# Patient Record
Sex: Female | Born: 1945 | Race: White | Hispanic: No | Marital: Married | Smoking: Never smoker
Health system: Southern US, Community
[De-identification: ages and names within clinical notes are randomized; demographics above are authoritative.]

## PROBLEM LIST (undated history)

## (undated) DIAGNOSIS — M1990 Unspecified osteoarthritis, unspecified site: Secondary | ICD-10-CM

## (undated) DIAGNOSIS — E785 Hyperlipidemia, unspecified: Secondary | ICD-10-CM

## (undated) DIAGNOSIS — G8929 Other chronic pain: Secondary | ICD-10-CM

## (undated) DIAGNOSIS — M810 Age-related osteoporosis without current pathological fracture: Secondary | ICD-10-CM

## (undated) DIAGNOSIS — Z8719 Personal history of other diseases of the digestive system: Secondary | ICD-10-CM

## (undated) DIAGNOSIS — M545 Low back pain, unspecified: Secondary | ICD-10-CM

## (undated) DIAGNOSIS — G40909 Epilepsy, unspecified, not intractable, without status epilepticus: Secondary | ICD-10-CM

## (undated) DIAGNOSIS — E039 Hypothyroidism, unspecified: Secondary | ICD-10-CM

## (undated) DIAGNOSIS — I480 Paroxysmal atrial fibrillation: Secondary | ICD-10-CM

## (undated) DIAGNOSIS — M419 Scoliosis, unspecified: Secondary | ICD-10-CM

## (undated) DIAGNOSIS — Z87442 Personal history of urinary calculi: Secondary | ICD-10-CM

## (undated) DIAGNOSIS — J45909 Unspecified asthma, uncomplicated: Secondary | ICD-10-CM

## (undated) DIAGNOSIS — K219 Gastro-esophageal reflux disease without esophagitis: Secondary | ICD-10-CM

## (undated) HISTORY — PX: VAGINAL HYSTERECTOMY: SUR661

## (undated) HISTORY — DX: Age-related osteoporosis without current pathological fracture: M81.0

## (undated) HISTORY — DX: Hyperlipidemia, unspecified: E78.5

## (undated) HISTORY — DX: Epilepsy, unspecified, not intractable, without status epilepticus: G40.909

## (undated) HISTORY — DX: Paroxysmal atrial fibrillation: I48.0

## (undated) HISTORY — DX: Gastro-esophageal reflux disease without esophagitis: K21.9

## (undated) HISTORY — DX: Hypothyroidism, unspecified: E03.9

## (undated) HISTORY — PX: TONSILLECTOMY: SUR1361

---

## 2004-06-01 DIAGNOSIS — J45909 Unspecified asthma, uncomplicated: Secondary | ICD-10-CM | POA: Insufficient documentation

## 2005-11-25 DIAGNOSIS — M818 Other osteoporosis without current pathological fracture: Secondary | ICD-10-CM | POA: Insufficient documentation

## 2006-02-26 DIAGNOSIS — D179 Benign lipomatous neoplasm, unspecified: Secondary | ICD-10-CM | POA: Insufficient documentation

## 2012-07-10 DIAGNOSIS — J309 Allergic rhinitis, unspecified: Secondary | ICD-10-CM | POA: Insufficient documentation

## 2017-01-30 HISTORY — PX: CARDIAC CATHETERIZATION: SHX172

## 2017-06-02 ENCOUNTER — Ambulatory Visit (INDEPENDENT_AMBULATORY_CARE_PROVIDER_SITE_OTHER): Payer: Medicare Other | Admitting: Internal Medicine

## 2017-06-02 ENCOUNTER — Other Ambulatory Visit: Payer: Self-pay | Admitting: Internal Medicine

## 2017-06-02 ENCOUNTER — Encounter: Payer: Self-pay | Admitting: Internal Medicine

## 2017-06-02 VITALS — BP 120/80 | HR 131 | Ht 63.0 in | Wt 139.0 lb

## 2017-06-02 DIAGNOSIS — I4891 Unspecified atrial fibrillation: Secondary | ICD-10-CM | POA: Diagnosis not present

## 2017-06-02 MED ORDER — FLECAINIDE ACETATE 50 MG PO TABS: 50.0000 mg | ORAL_TABLET | Freq: Two times a day (BID) | ORAL | 11 refills | Status: DC

## 2017-06-02 NOTE — Progress Notes (Signed)
Electrophysiology Office Note   Date:  06/02/2017   ID:  Desiree Everett, DOB 03-Nov-1945, MRN [id number]  PCP:  Romeo Rabonaplan, Michael, MD  Cardiologist:  Dr Mertha BaarsZachari Primary Electrophysiologist: Hillis RangeJames Carmin Alvidrez, MD    CC: afib   History of Present Illness: Desiree Everett is a 72 y.o. female who presents today for electrophysiology evaluation.   She presents as self referral for afib evaluation.  She reports being diagnosed with afib in September after presenting for colonoscopy.  In retrospect, she thinks she has had afib "for years".  She presented with AF with RVR and was admitted to [facility].  She was placed on eliquis and metoprolol.   Episodes have been increasing in frequency and duration since that time.  She has tachypalpitations and fatigue.  She feels "washed out" after her afib. She has been evaluated by Dr Mertha BaarsZachari.  She underwent cath which revealed no CAD (his note reviewed).  Due to ongoing symptoms, she now presents for EP consultation.  Today, she denies symptoms of chest pain, shortness of breath, orthopnea, PND, lower extremity edema, claudication, dizziness, presyncope, syncope, bleeding, or neurologic sequela. The patient is tolerating medications without difficulties and is otherwise without complaint today.    Past Medical History:  Diagnosis Date  . GERD (gastroesophageal reflux disease)   . Hyperlipidemia   . Hypothyroidism   . Osteoporosis   . Paroxysmal atrial fibrillation (HCC)   . Seizure disorder (HCC)    no seizures since age [age]s, on keppra chronically   FH- + CAD   Current Outpatient Medications  Medication Sig Dispense Refill  . atorvastatin (LIPITOR) 10 MG tablet Take 10 mg by mouth at bedtime.  11  . cholecalciferol (VITAMIN D) 1000 units tablet Take 1,000 Units by mouth daily.    Marland Kitchen. denosumab (PROLIA) 60 MG/ML SOLN injection Inject 60 mg into the skin every 6 (six) months. Administer in upper arm, thigh, or abdomen    . ELIQUIS 5  MG TABS tablet Take 5 mg by mouth daily.    Marland Kitchen. esomeprazole (NEXIUM) 40 MG capsule Take 40 mg by mouth daily.  5  . HYDROcodone-acetaminophen (NORCO/VICODIN) 5-325 MG tablet Take 1 tablet by mouth 3 (three) times daily as needed.  0  . levETIRAcetam (KEPPRA) 500 MG tablet Take 500 mg by mouth 2 (two) times daily.    Marland Kitchen. levothyroxine (SYNTHROID, LEVOTHROID) 50 MCG tablet Take 50 mcg by mouth daily.  11  . metoprolol succinate (TOPROL-XL) 25 MG 24 hr tablet Take 25 mg by mouth 2 (two) times daily.    . Multiple Vitamin (MULTIVITAMIN) capsule Take 1 capsule by mouth daily.    . flecainide (TAMBOCOR) 50 MG tablet Take 1 tablet (50 mg total) by mouth 2 (two) times daily. 60 tablet 11   No current facility-administered medications for this visit.     Allergies:   Patient has no known allergies.   Social History:  The patient  reports that  has never smoked. she has never used smokeless tobacco. She reports that she does not drink alcohol or use drugs.     ROS:  Please see the history of present illness.   All other systems are personally reviewed and negative.    PHYSICAL EXAM: VS:  BP 120/80   Pulse (!) 131   Ht 5\' 3"  (1.6 m)   Wt 139 lb (63 kg)   BMI 24.62 kg/m  , BMI Body mass index is 24.62 kg/m. GEN: Well nourished, well developed, in no  acute distress  HEENT: normal  Neck: no JVD, carotid bruits, or masses Cardiac: iRRR; no murmurs, rubs, or gallops,no edema  Respiratory:  clear to auscultation bilaterally, normal work of breathing GI: soft, nontender, nondistended, + BS MS: no deformity or atrophy  Skin: warm and dry  Neuro:  Strength and sensation are intact Psych: euthymic mood, full affect  EKG:  EKG is ordered today. The ekg ordered today is personally reviewed and shows afib with RVR (V rates 130s)    Wt Readings from Last 3 Encounters:  06/02/17 139 lb (63 kg)      Other studies personally reviewed: Additional studies/ records that were reviewed today include: Dr  Charlyne Petrin notes,   Review of the above records today demonstrates: EF normal by echo at [facility], no CAD by cath   ASSESSMENT AND PLAN:  1.  Paroxysmal atrial fibrillation The patient has symptomatic atrial fibrillation.  V rates are elevated.  She will take additional metoprolol with V rates > 100 bpm.  Given resting heart rate of 60s, I am reluctant to increase standing dose currently. Therapeutic strategies for afib including medicine and ablation were discussed in detail with the patient today. Risk, benefits, and alternatives to flecainide was discussed.  She wishes to try this medicine.  I will therefore start flecainide 50mg  BID Follow-up with me in [place] in 2-3 weeks. chads2vasc score is 2.  On eliquis    Follow-up:  Follow-up with me in [place] in 2-3 weeks.  Current medicines are reviewed at length with the patient today.   The patient does not have concerns regarding her medicines.  The following changes were made today:  none  Labs/ tests ordered today include:  Orders Placed This Encounter  Procedures  . EKG 12-Lead     Signed, Hillis Range, MD  06/02/2017 12:11 PM     [facility] [address] [place] [address] [phone] (office) [phone] (fax)

## 2017-06-02 NOTE — Patient Instructions (Addendum)
Medication Instructions:  Your physician has recommended you make the following change in your medication:  1.  Start taking flecainide 50 mg one tablet by mouth twice a day.  Labwork: None ordered.  Testing/Procedures: None ordered.  Follow-Up: Your physician wants you to follow-up in: [place] with Dr. Johney FrameAllred on June 20, 2017 @ 11:15 am.  Any Other Special Instructions Will Be Listed Below (If Applicable).  If you need a refill on your cardiac medications before your next appointment, please call your pharmacy.  Flecainide tablets What is this medicine? FLECAINIDE (FLEK a nide) is an antiarrhythmic drug. This medicine is used to prevent irregular heart rhythm. It can also slow down fast heartbeats called tachycardia. This medicine may be used for other purposes; ask your health care provider or pharmacist if you have questions. COMMON BRAND NAME(S): Tambocor What should I tell my health care provider before I take this medicine? They need to know if you have any of these conditions: -abnormal levels of potassium in the blood -heart disease including heart rhythm and heart rate problems -kidney or liver disease -recent heart attack -an unusual or allergic reaction to flecainide, local anesthetics, other medicines, foods, dyes, or preservatives -pregnant or trying to get pregnant -breast-feeding How should I use this medicine? Take this medicine by mouth with a glass of water. Follow the directions on the prescription label. You can take this medicine with or without food. Take your doses at regular intervals. Do not take your medicine more often than directed. Do not stop taking this medicine suddenly. This may cause serious, heart-related side effects. If your doctor wants you to stop the medicine, the dose may be slowly lowered over time to avoid any side effects. Talk to your pediatrician regarding the use of this medicine in children. While this drug may be prescribed for children  as young as 1 year of age for selected conditions, precautions do apply. Overdosage: If you think you have taken too much of this medicine contact a poison control center or emergency room at once. NOTE: This medicine is only for you. Do not share this medicine with others. What if I miss a dose? If you miss a dose, take it as soon as you can. If it is almost time for your next dose, take only that dose. Do not take double or extra doses. What may interact with this medicine? Do not take this medicine with any of the following medications: -amoxapine -arsenic trioxide -certain antibiotics like clarithromycin, erythromycin, gatifloxacin, gemifloxacin, levofloxacin, moxifloxacin, sparfloxacin, or troleandomycin -certain antidepressants called tricyclic antidepressants like amitriptyline, imipramine, or nortriptyline -certain medicines to control heart rhythm like disopyramide, dofetilide, encainide, moricizine, procainamide, propafenone, and quinidine -cisapride -cyclobenzaprine -delavirdine -droperidol -haloperidol -hawthorn -imatinib -levomethadyl -maprotiline -medicines for malaria like chloroquine and halofantrine -pentamidine -phenothiazines like chlorpromazine, mesoridazine, prochlorperazine, thioridazine -pimozide -quinine -ranolazine -ritonavir -sertindole -ziprasidone This medicine may also interact with the following medications: -cimetidine -medicines for angina or high blood pressure -medicines to control heart rhythm like amiodarone and digoxin This list may not describe all possible interactions. Give your health care provider a list of all the medicines, herbs, non-prescription drugs, or dietary supplements you use. Also tell them if you smoke, drink alcohol, or use illegal drugs. Some items may interact with your medicine. What should I watch for while using this medicine? Visit your doctor or health care professional for regular checks on your progress. Because your  condition and the use of this medicine carries some risk, it is a good  idea to carry an identification card, necklace or bracelet with details of your condition, medications and doctor or health care professional. Check your blood pressure and pulse rate regularly. Ask your health care professional what your blood pressure and pulse rate should be, and when you should contact him or her. Your doctor or health care professional also may schedule regular blood tests and electrocardiograms to check your progress. You may get drowsy or dizzy. Do not drive, use machinery, or do anything that needs mental alertness until you know how this medicine affects you. Do not stand or sit up quickly, especially if you are an older patient. This reduces the risk of dizzy or fainting spells. Alcohol can make you more dizzy, increase flushing and rapid heartbeats. Avoid alcoholic drinks. What side effects may I notice from receiving this medicine? Side effects that you should report to your doctor or health care professional as soon as possible: -chest pain, continued irregular heartbeats -difficulty breathing -swelling of the legs or feet -trembling, shaking -unusually weak or tired Side effects that usually do not require medical attention (report to your doctor or health care professional if they continue or are bothersome): -blurred vision -constipation -headache -nausea, vomiting -stomach pain This list may not describe all possible side effects. Call your doctor for medical advice about side effects. You may report side effects to FDA at 1-800-FDA-1088. Where should I keep my medicine? Keep out of the reach of children. Store at room temperature between 15 and 30 degrees C (59 and 86 degrees F). Protect from light. Keep container tightly closed. Throw away any unused medicine after the expiration date. NOTE: This sheet is a summary. It may not cover all possible information. If you have questions about this  medicine, talk to your doctor, pharmacist, or health care provider.  2018 Elsevier/Gold Standard (2007-07-22 16:46:09)

## 2017-06-20 ENCOUNTER — Encounter: Payer: Self-pay | Admitting: Internal Medicine

## 2017-06-20 ENCOUNTER — Other Ambulatory Visit: Payer: Self-pay

## 2017-06-20 ENCOUNTER — Ambulatory Visit (INDEPENDENT_AMBULATORY_CARE_PROVIDER_SITE_OTHER): Payer: Medicare Other | Admitting: Internal Medicine

## 2017-06-20 ENCOUNTER — Other Ambulatory Visit: Payer: Self-pay | Admitting: Internal Medicine

## 2017-06-20 VITALS — BP 110/73 | HR 56 | Ht 63.0 in | Wt 148.0 lb

## 2017-06-20 DIAGNOSIS — I481 Persistent atrial fibrillation: Secondary | ICD-10-CM | POA: Diagnosis not present

## 2017-06-20 DIAGNOSIS — I4819 Other persistent atrial fibrillation: Secondary | ICD-10-CM

## 2017-06-20 MED ORDER — FLECAINIDE ACETATE 100 MG PO TABS: 100.0000 mg | ORAL_TABLET | Freq: Two times a day (BID) | ORAL | 6 refills | Status: DC

## 2017-06-20 NOTE — Progress Notes (Signed)
PCP: Romeo Rabon, MD Primary Cardiologist: Dr Rockne Menghini Primary EP: Dr Neale Burly Desiree Everett is a 72 y.o. female who presents today for routine electrophysiology followup.  Since last being seen in our clinic, the patient reports doing reasonably well. She continues to have afib.  + palpitations and fatigue.  Today, she denies symptoms of chest pain, shortness of breath,  lower extremity edema, dizziness, presyncope, or syncope.  The patient is otherwise without complaint today.   Past Medical History:  Diagnosis Date  . GERD (gastroesophageal reflux disease)   . Hyperlipidemia   . Hypothyroidism   . Osteoporosis   . Paroxysmal atrial fibrillation (HCC)   . Seizure disorder (HCC)    no seizures since age 42s, on keppra chronically   ROS- all systems are reviewed and negatives except as per HPI above  Current Outpatient Medications  Medication Sig Dispense Refill  . atorvastatin (LIPITOR) 10 MG tablet Take 10 mg by mouth at bedtime.  11  . cholecalciferol (VITAMIN D) 1000 units tablet Take 1,000 Units by mouth daily.    Marland Kitchen denosumab (PROLIA) 60 MG/ML SOLN injection Inject 60 mg into the skin every 6 (six) months. Administer in upper arm, thigh, or abdomen    . ELIQUIS 5 MG TABS tablet Take 5 mg by mouth daily.    Marland Kitchen esomeprazole (NEXIUM) 40 MG capsule Take 40 mg by mouth daily.  5  . flecainide (TAMBOCOR) 50 MG tablet TAKE 1 TABLET(50 MG) BY MOUTH TWICE DAILY 180 tablet 3  . HYDROcodone-acetaminophen (NORCO/VICODIN) 5-325 MG tablet Take 1 tablet by mouth 3 (three) times daily as needed.  0  . levETIRAcetam (KEPPRA) 500 MG tablet Take 500 mg by mouth 2 (two) times daily.    Marland Kitchen levothyroxine (SYNTHROID, LEVOTHROID) 50 MCG tablet Take 50 mcg by mouth daily.  11  . metoprolol succinate (TOPROL-XL) 25 MG 24 hr tablet Take 25 mg by mouth 2 (two) times daily.    . Multiple Vitamin (MULTIVITAMIN) capsule Take 1 capsule by mouth daily.     No current facility-administered medications  for this visit.     Physical Exam: Vitals:   06/20/17 1100  BP: 110/73  Pulse: (!) 56  SpO2: 98%  Weight: 148 lb (67.1 kg)  Height: 5\' 3"  (1.6 m)    GEN- The patient is well appearing, alert and oriented x 3 today.   Head- normocephalic, atraumatic Eyes-  Sclera clear, conjunctiva pink Ears- hearing intact Oropharynx- clear Lungs- Clear to ausculation bilaterally, normal work of breathing Heart- irregular rate and rhythm, no murmurs, rubs or gallops, PMI not laterally displaced GI- soft, NT, ND, + BS Extremities- no clubbing, cyanosis, or edema  EKG tracing ordered today is personally reviewed and shows sinus rhythm 62 bpm with PACs, PR 162 msec, Qtc 450 msec  Assessment and Plan:  1. Persistent atrial fibrillation Continues to have afib, which appears to now be more persistent.  She is actually in sinus rhythm today. She has just worn a holter monitor from Dr Oneita Jolly office which shows afib 100% of the time (I do not have actual monitor tracings to review) chads2vasc score is 2.  Continue eliquis Therapeutic strategies for afib including medicine (increased flecainide, multaq, tikosyn) and ablation were discussed in detail with the patient today. Risk, benefits, and alternatives to EP study and radiofrequency ablation for afib were also discussed in detail today. These risks include but are not limited to stroke, bleeding, vascular damage, tamponade, perforation, damage to the esophagus, lungs, and  other structures, pulmonary vein stenosis, worsening renal function, and death. The patient understands these risk and wishes to continue medical therapy.  She would prefer to try increasing flecainide. I will therefore increase flecainide to 100mg  BID Return on Monday for ekg I will see again in 2-3 weeks   Hillis RangeJames Desiree Hatcher MD, [facility] 06/20/2017 11:30 AM

## 2017-06-20 NOTE — Patient Instructions (Addendum)
Medication Instructions:   Increase Flecainide to 100mg  twice a day.  Continue all other medications.    Labwork: none  Testing/Procedures: none  Follow-Up: 2 weeks - Dr. Johney FrameAllred   Any Other Special Instructions Will Be Listed Below (If Applicable). EKG - nurse visit Monday.   If you need a refill on your cardiac medications before your next appointment, please call your pharmacy.

## 2017-06-23 ENCOUNTER — Ambulatory Visit (INDEPENDENT_AMBULATORY_CARE_PROVIDER_SITE_OTHER): Payer: Medicare Other | Admitting: *Deleted

## 2017-06-23 DIAGNOSIS — I481 Persistent atrial fibrillation: Secondary | ICD-10-CM | POA: Diagnosis not present

## 2017-06-23 DIAGNOSIS — I4819 Other persistent atrial fibrillation: Secondary | ICD-10-CM

## 2017-06-23 NOTE — Progress Notes (Signed)
Patient in office this morning for EKG.  Recent increase on her Flecainide to 100mg  twice a day.  Did notice slight dizziness on the first day of the increase, but tolerating well since without difficulty.  Follow up already scheduled with Dr. Johney FrameAllred for 07/04/2017 [place] in [place].

## 2017-06-23 NOTE — Addendum Note (Signed)
Addended by: Lesle ChrisHILL, Nida Manfredi G on: 06/23/2017 12:32 PM   Modules accepted: Orders

## 2017-06-27 NOTE — Progress Notes (Signed)
Patient notified and verbalized understanding. 

## 2017-06-27 NOTE — Progress Notes (Signed)
Hillis RangeAllred, James, MD  Lesle ChrisHill, Alvia Tory G, LPN        Thanks!  Please let her know that I think the ekg looks ok  Continue current dose unless problems arise.

## 2017-07-04 ENCOUNTER — Encounter: Payer: Self-pay | Admitting: *Deleted

## 2017-07-04 ENCOUNTER — Ambulatory Visit (INDEPENDENT_AMBULATORY_CARE_PROVIDER_SITE_OTHER): Payer: Medicare Other | Admitting: Internal Medicine

## 2017-07-04 VITALS — BP 102/74 | HR 49 | Ht 63.0 in | Wt 142.0 lb

## 2017-07-04 DIAGNOSIS — I4819 Other persistent atrial fibrillation: Secondary | ICD-10-CM

## 2017-07-04 DIAGNOSIS — I481 Persistent atrial fibrillation: Secondary | ICD-10-CM | POA: Diagnosis not present

## 2017-07-04 NOTE — Patient Instructions (Signed)
Medication Instructions:   Your physician has recommended you make the following change in your medication:   Decrease toprol xl to 25 mg by mouth daily.  Continue all other medications the same.  Labwork:  NONE  Testing/Procedures: Your physician has recommended that you have an ablation. Catheter ablation is a medical procedure used to treat some cardiac arrhythmias (irregular heartbeats). During catheter ablation, a long, thin, flexible tube is put into a blood vessel in your groin (upper thigh), or neck. This tube is called an ablation catheter. It is then guided to your heart through the blood vessel. Radio frequency waves destroy small areas of heart tissue where abnormal heartbeats may cause an arrhythmia to start. Please see the instruction sheet given to you today. Ancil BoozerJenny Smith RN will contact you about this procedure.  Follow-Up:  Your physician recommends that you schedule a follow-up appointment in: pending.  Any Other Special Instructions Will Be Listed Below (If Applicable).  If you need a refill on your cardiac medications before your next appointment, please call your pharmacy.

## 2017-07-04 NOTE — Progress Notes (Signed)
PCP: Romeo Rabonaplan, Michael, MD Primary Cardiologist: Dr Rockne MenghiniZakhary Primary EP: Dr Neale BurlyAllred  Angella Ewing Smitty CordsBruce is a 72 y.o. female who presents today for routine electrophysiology followup.  Since last being seen in our clinic, the patient reports doing very well. + dizziness with flecainide.  + fatigue.  Still having afib, though less often.  Today, she denies symptoms of  chest pain, shortness of breath,  lower extremity edema, presyncope, or syncope.  The patient is otherwise without complaint today.   Past Medical History:  Diagnosis Date  . GERD (gastroesophageal reflux disease)   . Hyperlipidemia   . Hypothyroidism   . Osteoporosis   . Paroxysmal atrial fibrillation (HCC)   . Seizure disorder (HCC)    no seizures since age [age]s, on keppra chronically   Past Surgical History:  Procedure Laterality Date  . ABDOMINAL HYSTERECTOMY      ROS- all systems are reviewed and negatives except as per HPI above  Current Outpatient Medications  Medication Sig Dispense Refill  . atorvastatin (LIPITOR) 10 MG tablet Take 10 mg by mouth at bedtime.  11  . cholecalciferol (VITAMIN D) 1000 units tablet Take 1,000 Units by mouth daily.    Marland Kitchen. denosumab (PROLIA) 60 MG/ML SOLN injection Inject 60 mg into the skin every 6 (six) months. Administer in upper arm, thigh, or abdomen    . ELIQUIS 5 MG TABS tablet Take 5 mg by mouth daily.    Marland Kitchen. esomeprazole (NEXIUM) 40 MG capsule Take 40 mg by mouth daily.  5  . flecainide (TAMBOCOR) 100 MG tablet TAKE 1 TABLET BY MOUTH TWICE DAILY 180 tablet 6  . HYDROcodone-acetaminophen (NORCO/VICODIN) 5-325 MG tablet Take 1 tablet by mouth 3 (three) times daily as needed.  0  . levETIRAcetam (KEPPRA) 500 MG tablet Take 500 mg by mouth 2 (two) times daily.    Marland Kitchen. levothyroxine (SYNTHROID, LEVOTHROID) 50 MCG tablet Take 50 mcg by mouth daily.  11  . metoprolol succinate (TOPROL-XL) 25 MG 24 hr tablet Take 25 mg by mouth 2 (two) times daily.    . Multiple Vitamin (MULTIVITAMIN)  capsule Take 1 capsule by mouth daily.     No current facility-administered medications for this visit.     Physical Exam: Vitals:   07/04/17 1101  BP: 102/74  Pulse: (!) 49  SpO2: 98%  Weight: 142 lb (64.4 kg)  Height: 5\' 3"  (1.6 m)    GEN- The patient is well appearing, alert and oriented x 3 today.   Head- normocephalic, atraumatic Eyes-  Sclera clear, conjunctiva pink Ears- hearing intact Oropharynx- clear Lungs- Clear to ausculation bilaterally, normal work of breathing Heart- Regular rate and rhythm, no murmurs, rubs or gallops, PMI not laterally displaced GI- soft, NT, ND, + BS Extremities- no clubbing, cyanosis, or edema  EKG tracing ordered today is personally reviewed and shows sinus bradycardia 44 bpm, PR 176 msec, Qtc 427 msec  Assessment and Plan:  1. Persistent afib She returns today for follow-up on flecainide 100mg  BID She continues to have afib and is not tolerating her medicines well.  Further changes are limited by bradycardia On eliquis (chads2vasc score is 2) Therapeutic strategies for afib including medicine and ablation were discussed in detail with the patient today. Risk, benefits, and alternatives to EP study and radiofrequency ablation for afib were also discussed in detail today. These risks include but are not limited to stroke, bleeding, vascular damage, tamponade, perforation, damage to the esophagus, lungs, and other structures, pulmonary vein stenosis, worsening renal  function, and death. The patient understands these risk and wishes to proceed.  We will therefore proceed with catheter ablation at the next available time.  Will require cardiac CT several days prior to exclude LAA thrombus.  Carto/ICE/ anesthesia will be required  Reduce toprol to 25mg  daily today.  If still symptomatic with bradycardia, reduce toprol to 12.5 mg BID Could also reduce flecainide if needed   Hillis Range MD, [facility] 07/04/2017 11:30 AM

## 2017-07-09 ENCOUNTER — Telehealth: Payer: Self-pay

## 2017-07-09 DIAGNOSIS — I4819 Other persistent atrial fibrillation: Secondary | ICD-10-CM

## 2017-07-09 NOTE — Telephone Encounter (Signed)
Call placed to Pt.  Pt would like to schedule afib ablation for Aug 12, 2017 first case.  Will call Pt tomorrow to discuss details.  Pt indicates understanding.

## 2017-07-10 NOTE — Telephone Encounter (Signed)
Pt scheduled for afib ablation Aug 12, 2017 Pt will come in for pre procedure labs Aug 05, 2017 and pick up her instructions for cardiac CT and ablation at that time. Will need cardiac CT scheduled. No further action at this time

## 2017-08-05 ENCOUNTER — Other Ambulatory Visit: Payer: Medicare Other

## 2017-08-06 ENCOUNTER — Other Ambulatory Visit: Payer: Medicare Other | Admitting: *Deleted

## 2017-08-06 DIAGNOSIS — I4819 Other persistent atrial fibrillation: Secondary | ICD-10-CM

## 2017-08-06 LAB — BASIC METABOLIC PANEL
BUN/Creatinine Ratio: 18 (ref 12–28)
BUN: 15 mg/dL (ref 8–27)
CALCIUM: 9.5 mg/dL (ref 8.7–10.3)
CO2: 26 mmol/L (ref 20–29)
CREATININE: 0.84 mg/dL (ref 0.57–1.00)
Chloride: 103 mmol/L (ref 96–106)
GFR calc Af Amer: 80 mL/min/{1.73_m2} (ref >59)
GFR, EST NON AFRICAN AMERICAN: 70 mL/min/{1.73_m2} (ref >59)
Glucose: 84 mg/dL (ref 65–99)
Potassium: 4.6 mmol/L (ref 3.5–5.2)
Sodium: 143 mmol/L (ref 134–144)

## 2017-08-06 LAB — CBC WITH DIFFERENTIAL/PLATELET
Basophils Absolute: 0 10*3/uL (ref 0.0–0.2)
Basos: 0 %
EOS (ABSOLUTE): 0 10*3/uL (ref 0.0–0.4)
EOS: 1 %
HEMATOCRIT: 37.5 % (ref 34.0–46.6)
Hemoglobin: 12.4 g/dL (ref 11.1–15.9)
IMMATURE GRANULOCYTES: 0 %
Immature Grans (Abs): 0 10*3/uL (ref 0.0–0.1)
LYMPHS ABS: 1.1 10*3/uL (ref 0.7–3.1)
Lymphs: 31 %
MCH: 31.3 pg (ref 26.6–33.0)
MCHC: 33.1 g/dL (ref 31.5–35.7)
MCV: 95 fL (ref 79–97)
Monocytes Absolute: 0.5 10*3/uL (ref 0.1–0.9)
Monocytes: 13 %
NEUTROS PCT: 55 %
Neutrophils Absolute: 2 10*3/uL (ref 1.4–7.0)
Platelets: 149 10*3/uL — ABNORMAL LOW (ref 150–379)
RBC: 3.96 x10E6/uL (ref 3.77–5.28)
RDW: 13.5 % (ref 12.3–15.4)
WBC: 3.6 10*3/uL (ref 3.4–10.8)

## 2017-08-07 ENCOUNTER — Ambulatory Visit (HOSPITAL_COMMUNITY)
Admission: RE | Admit: 2017-08-07 | Discharge: 2017-08-07 | Disposition: A | Discharge status: Home / Self Care | Payer: Medicare Other | Source: Ambulatory Visit | Attending: Internal Medicine | Admitting: Internal Medicine

## 2017-08-07 ENCOUNTER — Encounter (HOSPITAL_COMMUNITY): Payer: Self-pay

## 2017-08-07 ENCOUNTER — Ambulatory Visit (HOSPITAL_COMMUNITY): Payer: Medicare Other

## 2017-08-07 DIAGNOSIS — K449 Diaphragmatic hernia without obstruction or gangrene: Secondary | ICD-10-CM | POA: Diagnosis not present

## 2017-08-07 DIAGNOSIS — I4819 Other persistent atrial fibrillation: Secondary | ICD-10-CM

## 2017-08-07 DIAGNOSIS — I481 Persistent atrial fibrillation: Secondary | ICD-10-CM | POA: Insufficient documentation

## 2017-08-07 DIAGNOSIS — I4891 Unspecified atrial fibrillation: Secondary | ICD-10-CM | POA: Diagnosis not present

## 2017-08-07 MED ORDER — IOPAMIDOL (ISOVUE-370) INJECTION 76%
100.0000 mL | Freq: Once | INTRAVENOUS | Status: AC | PRN
  Administered 2017-08-07: 80 mL via INTRAVENOUS

## 2017-08-07 MED ORDER — IOPAMIDOL (ISOVUE-370) INJECTION 76%
INTRAVENOUS | Status: AC
  Filled 2017-08-07: qty 100

## 2017-08-11 ENCOUNTER — Encounter (HOSPITAL_COMMUNITY): Payer: Self-pay | Admitting: Anesthesiology

## 2017-08-11 NOTE — Anesthesia Preprocedure Evaluation (Addendum)
Anesthesia Evaluation  Patient identified by MRN, date of birth, ID band Patient awake    Reviewed: Allergy & Precautions, NPO status , Patient's Chart, lab work & pertinent test results  Airway Mallampati: I       Dental no notable dental hx. (+) Teeth Intact   Pulmonary neg pulmonary ROS,    Pulmonary exam normal breath sounds clear to auscultation       Cardiovascular negative cardio ROS Normal cardiovascular exam Rhythm:Regular Rate:Normal     Neuro/Psych Seizures -, Well Controlled,  negative psych ROS   GI/Hepatic Neg liver ROS, GERD  Medicated,  Endo/Other  Hypothyroidism   Renal/GU negative Renal ROS  negative genitourinary   Musculoskeletal negative musculoskeletal ROS (+)   Abdominal Normal abdominal exam  (+)   Peds  Hematology   Anesthesia Other Findings   Reproductive/Obstetrics                            Anesthesia Physical Anesthesia Plan  ASA: II  Anesthesia Plan: General   Post-op Pain Management:    Induction:   PONV Risk Score and Plan: 3 and Ondansetron and Dexamethasone  Airway Management Planned: LMA  Additional Equipment:   Intra-op Plan:   Post-operative Plan:   Informed Consent: I have reviewed the patients History and Physical, chart, labs and discussed the procedure including the risks, benefits and alternatives for the proposed anesthesia with the patient or authorized representative who has indicated his/her understanding and acceptance.     Plan Discussed with: CRNA  Anesthesia Plan Comments:        Anesthesia Quick Evaluation

## 2017-08-12 ENCOUNTER — Ambulatory Visit (HOSPITAL_COMMUNITY): Payer: Medicare Other | Admitting: Anesthesiology

## 2017-08-12 ENCOUNTER — Ambulatory Visit (HOSPITAL_COMMUNITY)
Admission: RE | Admit: 2017-08-12 | Discharge: 2017-08-13 | Disposition: A | Discharge status: Home / Self Care | Payer: Medicare Other | Source: Ambulatory Visit | Attending: Internal Medicine | Admitting: Internal Medicine

## 2017-08-12 ENCOUNTER — Other Ambulatory Visit: Payer: Self-pay

## 2017-08-12 ENCOUNTER — Encounter (HOSPITAL_COMMUNITY)
Admission: RE | Disposition: A | Discharge status: Home / Self Care | Payer: Self-pay | Source: Ambulatory Visit | Attending: Internal Medicine

## 2017-08-12 ENCOUNTER — Encounter (HOSPITAL_COMMUNITY): Payer: Self-pay | Admitting: Certified Registered Nurse Anesthetist

## 2017-08-12 DIAGNOSIS — Z79899 Other long term (current) drug therapy: Secondary | ICD-10-CM | POA: Diagnosis not present

## 2017-08-12 DIAGNOSIS — I480 Paroxysmal atrial fibrillation: Secondary | ICD-10-CM | POA: Diagnosis present

## 2017-08-12 DIAGNOSIS — Z7983 Long term (current) use of bisphosphonates: Secondary | ICD-10-CM | POA: Diagnosis not present

## 2017-08-12 DIAGNOSIS — Z7901 Long term (current) use of anticoagulants: Secondary | ICD-10-CM | POA: Insufficient documentation

## 2017-08-12 DIAGNOSIS — E039 Hypothyroidism, unspecified: Secondary | ICD-10-CM | POA: Diagnosis not present

## 2017-08-12 DIAGNOSIS — Z79890 Hormone replacement therapy (postmenopausal): Secondary | ICD-10-CM | POA: Diagnosis not present

## 2017-08-12 DIAGNOSIS — K219 Gastro-esophageal reflux disease without esophagitis: Secondary | ICD-10-CM | POA: Insufficient documentation

## 2017-08-12 DIAGNOSIS — E785 Hyperlipidemia, unspecified: Secondary | ICD-10-CM | POA: Diagnosis not present

## 2017-08-12 DIAGNOSIS — M810 Age-related osteoporosis without current pathological fracture: Secondary | ICD-10-CM | POA: Diagnosis not present

## 2017-08-12 DIAGNOSIS — G40909 Epilepsy, unspecified, not intractable, without status epilepticus: Secondary | ICD-10-CM | POA: Insufficient documentation

## 2017-08-12 DIAGNOSIS — I481 Persistent atrial fibrillation: Secondary | ICD-10-CM | POA: Diagnosis not present

## 2017-08-12 HISTORY — DX: Low back pain, unspecified: M54.50

## 2017-08-12 HISTORY — DX: Unspecified asthma, uncomplicated: J45.909

## 2017-08-12 HISTORY — DX: Personal history of urinary calculi: Z87.442

## 2017-08-12 HISTORY — PX: ATRIAL FIBRILLATION ABLATION: EP1191

## 2017-08-12 HISTORY — DX: Other chronic pain: G89.29

## 2017-08-12 HISTORY — DX: Personal history of other diseases of the digestive system: Z87.19

## 2017-08-12 HISTORY — DX: Low back pain: M54.5

## 2017-08-12 HISTORY — DX: Scoliosis, unspecified: M41.9

## 2017-08-12 HISTORY — DX: Unspecified osteoarthritis, unspecified site: M19.90

## 2017-08-12 LAB — POCT ACTIVATED CLOTTING TIME
ACTIVATED CLOTTING TIME: 395 s
ACTIVATED CLOTTING TIME: 136 s

## 2017-08-12 SURGERY — ATRIAL FIBRILLATION ABLATION
Anesthesia: General

## 2017-08-12 MED ORDER — LEVETIRACETAM 500 MG PO TABS
500.0000 mg | ORAL_TABLET | Freq: Two times a day (BID) | ORAL | Status: DC
  Administered 2017-08-12: 22:00:00 500 mg via ORAL
  Filled 2017-08-12 (×2): qty 1

## 2017-08-12 MED ORDER — DEXTROSE 5 % IV SOLN
INTRAVENOUS | Status: DC | PRN
  Administered 2017-08-12: 15 ug/min via INTRAVENOUS

## 2017-08-12 MED ORDER — HEPARIN SODIUM (PORCINE) 1000 UNIT/ML IJ SOLN
INTRAMUSCULAR | Status: AC
  Filled 2017-08-12: qty 1

## 2017-08-12 MED ORDER — ISOPROTERENOL HCL 0.2 MG/ML IJ SOLN
INTRAVENOUS | Status: DC | PRN
  Administered 2017-08-12: 20 ug/min via INTRAVENOUS

## 2017-08-12 MED ORDER — METOPROLOL SUCCINATE ER 25 MG PO TB24: 6.2500 mg | ORAL_TABLET | Freq: Every day | ORAL | Status: DC

## 2017-08-12 MED ORDER — HEPARIN SODIUM (PORCINE) 1000 UNIT/ML IJ SOLN
INTRAMUSCULAR | Status: DC | PRN
  Administered 2017-08-12: 12000 [IU] via INTRAVENOUS
  Administered 2017-08-12: 1000 [IU] via INTRAVENOUS

## 2017-08-12 MED ORDER — HYDROCODONE-ACETAMINOPHEN 5-325 MG PO TABS: 1.0000 | ORAL_TABLET | ORAL | Status: DC | PRN

## 2017-08-12 MED ORDER — BUPIVACAINE HCL (PF) 0.25 % IJ SOLN
INTRAMUSCULAR | Status: DC | PRN
  Administered 2017-08-12: 45 mL

## 2017-08-12 MED ORDER — LEVOTHYROXINE SODIUM 50 MCG PO TABS: 50.0000 ug | ORAL_TABLET | Freq: Every day | ORAL | Status: DC

## 2017-08-12 MED ORDER — APIXABAN 5 MG PO TABS
5.0000 mg | ORAL_TABLET | Freq: Two times a day (BID) | ORAL | Status: DC
  Administered 2017-08-12 – 2017-08-13 (×2): 5 mg via ORAL
  Filled 2017-08-12 (×2): qty 1

## 2017-08-12 MED ORDER — IOPAMIDOL (ISOVUE-370) INJECTION 76%
INTRAVENOUS | Status: AC
  Filled 2017-08-12: qty 50

## 2017-08-12 MED ORDER — SODIUM CHLORIDE 0.9% FLUSH
3.0000 mL | Freq: Two times a day (BID) | INTRAVENOUS | Status: DC
  Administered 2017-08-12 (×2): 3 mL via INTRAVENOUS

## 2017-08-12 MED ORDER — BUPIVACAINE HCL (PF) 0.25 % IJ SOLN
INTRAMUSCULAR | Status: AC
  Filled 2017-08-12: qty 30

## 2017-08-12 MED ORDER — OFF THE BEAT BOOK
Freq: Once | Status: AC
  Administered 2017-08-12: 1
  Filled 2017-08-12: qty 1

## 2017-08-12 MED ORDER — SODIUM CHLORIDE 0.9 % IV SOLN
INTRAVENOUS | Status: DC
  Administered 2017-08-12 (×2): via INTRAVENOUS

## 2017-08-12 MED ORDER — IOPAMIDOL (ISOVUE-370) INJECTION 76%
INTRAVENOUS | Status: DC | PRN
  Administered 2017-08-12: 10 mL via INTRAVENOUS

## 2017-08-12 MED ORDER — HEPARIN (PORCINE) IN NACL 1000-0.9 UT/500ML-% IV SOLN
INTRAVENOUS | Status: AC
  Filled 2017-08-12: qty 500

## 2017-08-12 MED ORDER — FENTANYL CITRATE (PF) 250 MCG/5ML IJ SOLN
INTRAMUSCULAR | Status: DC | PRN
  Administered 2017-08-12 (×2): 50 ug via INTRAVENOUS

## 2017-08-12 MED ORDER — ONDANSETRON HCL 4 MG/2ML IJ SOLN
INTRAMUSCULAR | Status: DC | PRN
  Administered 2017-08-12: 4 mg via INTRAVENOUS

## 2017-08-12 MED ORDER — MIDAZOLAM HCL 2 MG/2ML IJ SOLN
INTRAMUSCULAR | Status: DC | PRN
  Administered 2017-08-12: 1 mg via INTRAVENOUS

## 2017-08-12 MED ORDER — PROPOFOL 10 MG/ML IV BOLUS
INTRAVENOUS | Status: DC | PRN
  Administered 2017-08-12: 100 mg via INTRAVENOUS

## 2017-08-12 MED ORDER — ACETAMINOPHEN 325 MG PO TABS: 650.0000 mg | ORAL_TABLET | ORAL | Status: DC | PRN

## 2017-08-12 MED ORDER — ONDANSETRON HCL 4 MG/2ML IJ SOLN: 4.0000 mg | Freq: Four times a day (QID) | INTRAMUSCULAR | Status: DC | PRN

## 2017-08-12 MED ORDER — SUGAMMADEX SODIUM 200 MG/2ML IV SOLN
INTRAVENOUS | Status: DC | PRN
  Administered 2017-08-12: 150 mg via INTRAVENOUS

## 2017-08-12 MED ORDER — SODIUM CHLORIDE 0.9% FLUSH: 3.0000 mL | INTRAVENOUS | Status: DC | PRN

## 2017-08-12 MED ORDER — ROCURONIUM BROMIDE 10 MG/ML (PF) SYRINGE
PREFILLED_SYRINGE | INTRAVENOUS | Status: DC | PRN
  Administered 2017-08-12: 40 mg via INTRAVENOUS

## 2017-08-12 MED ORDER — SODIUM CHLORIDE 0.9 % IV SOLN: 250.0000 mL | INTRAVENOUS | Status: DC | PRN

## 2017-08-12 MED ORDER — DEXAMETHASONE SODIUM PHOSPHATE 10 MG/ML IJ SOLN
INTRAMUSCULAR | Status: DC | PRN
  Administered 2017-08-12: 10 mg via INTRAVENOUS

## 2017-08-12 MED ORDER — HEPARIN (PORCINE) IN NACL 2-0.9 UNITS/ML
INTRAMUSCULAR | Status: AC | PRN
  Administered 2017-08-12: 500 mL

## 2017-08-12 MED ORDER — PHENYLEPHRINE HCL 10 MG/ML IJ SOLN
INTRAMUSCULAR | Status: DC | PRN
  Administered 2017-08-12: 80 ug via INTRAVENOUS

## 2017-08-12 MED ORDER — LIDOCAINE 2% (20 MG/ML) 5 ML SYRINGE
INTRAMUSCULAR | Status: DC | PRN
  Administered 2017-08-12: 60 mg via INTRAVENOUS

## 2017-08-12 MED ORDER — ISOPROTERENOL HCL 0.2 MG/ML IJ SOLN
INTRAMUSCULAR | Status: AC
  Filled 2017-08-12: qty 5

## 2017-08-12 MED ORDER — PROTAMINE SULFATE 10 MG/ML IV SOLN
INTRAVENOUS | Status: DC | PRN
  Administered 2017-08-12: 40 mg via INTRAVENOUS

## 2017-08-12 SURGICAL SUPPLY — 18 items
BLANKET WARM UNDERBOD FULL ACC (MISCELLANEOUS) ×3 IMPLANT
CATH MAPPNG PENTARAY F 2-6-2MM (CATHETERS) ×1 IMPLANT
CATH NAVISTAR SMARTTOUCH DF (ABLATOR) ×3 IMPLANT
CATH SOUNDSTAR 3D IMAGING (CATHETERS) ×3 IMPLANT
CATH WEBSTER BI DIR CS D-F CRV (CATHETERS) ×3 IMPLANT
COVER SWIFTLINK CONNECTOR (BAG) ×3 IMPLANT
NEEDLE TRANSEP BRK 71CM 407200 (NEEDLE) ×3 IMPLANT
PACK EP LATEX FREE (CUSTOM PROCEDURE TRAY) ×2
PACK EP LF (CUSTOM PROCEDURE TRAY) ×1 IMPLANT
PAD DEFIB LIFELINK (PAD) ×3 IMPLANT
PATCH CARTO3 (PAD) ×3 IMPLANT
PENTARAY F 2-6-2MM (CATHETERS) ×3
SHEATH AVANTI 11F 11CM (SHEATH) ×3 IMPLANT
SHEATH PINNACLE 7F 10CM (SHEATH) ×6 IMPLANT
SHEATH PINNACLE 9F 10CM (SHEATH) ×3 IMPLANT
SHEATH SWARTZ TS SL2 63CM 8.5F (SHEATH) ×3 IMPLANT
SHIELD RADPAD SCOOP 12X17 (MISCELLANEOUS) ×3 IMPLANT
TUBING SMART ABLATE COOLFLOW (TUBING) ×3 IMPLANT

## 2017-08-12 NOTE — H&P (Signed)
Desiree Everett is a 72 y.o. female who presents today for routine electrophysiology study and ablation for afib.   Today, she denies symptoms of  chest pain, shortness of breath,  lower extremity edema, presyncope, or syncope.  The patient is otherwise without complaint today.       Past Medical History:  Diagnosis Date  . GERD (gastroesophageal reflux disease)   . Hyperlipidemia   . Hypothyroidism   . Osteoporosis   . Paroxysmal atrial fibrillation (HCC)   . Seizure disorder (HCC)    no seizures since age 64s, on keppra chronically        Past Surgical History:  Procedure Laterality Date  . ABDOMINAL HYSTERECTOMY      ROS- all systems are reviewed and negatives except as per HPI above        Current Outpatient Medications  Medication Sig Dispense Refill  . atorvastatin (LIPITOR) 10 MG tablet Take 10 mg by mouth at bedtime.  11  . cholecalciferol (VITAMIN D) 1000 units tablet Take 1,000 Units by mouth daily.    Marland Kitchen denosumab (PROLIA) 60 MG/ML SOLN injection Inject 60 mg into the skin every 6 (six) months. Administer in upper arm, thigh, or abdomen    . ELIQUIS 5 MG TABS tablet Take 5 mg by mouth daily.    Marland Kitchen esomeprazole (NEXIUM) 40 MG capsule Take 40 mg by mouth daily.  5  . flecainide (TAMBOCOR) 100 MG tablet TAKE 1 TABLET BY MOUTH TWICE DAILY 180 tablet 6  . HYDROcodone-acetaminophen (NORCO/VICODIN) 5-325 MG tablet Take 1 tablet by mouth 3 (three) times daily as needed.  0  . levETIRAcetam (KEPPRA) 500 MG tablet Take 500 mg by mouth 2 (two) times daily.    Marland Kitchen levothyroxine (SYNTHROID, LEVOTHROID) 50 MCG tablet Take 50 mcg by mouth daily.  11  . metoprolol succinate (TOPROL-XL) 25 MG 24 hr tablet Take 25 mg by mouth 2 (two) times daily.    . Multiple Vitamin (MULTIVITAMIN) capsule Take 1 capsule by mouth daily.     No current facility-administered medications for this visit.     Vitals:   08/12/17 0548  BP: (!) 161/86  Pulse: 73  Temp:  97.6 F (36.4 C)  SpO2: 100%   GEN- The patient is well appearing, alert and oriented x 3 today.   Head- normocephalic, atraumatic Eyes-  Sclera clear, conjunctiva pink Ears- hearing intact Oropharynx- clear Lungs- Clear to ausculation bilaterally, normal work of breathing Heart- Regular rate and rhythm, no murmurs, rubs or gallops, PMI not laterally displaced GI- soft, NT, ND, + BS Extremities- no clubbing, cyanosis, or edema   Assessment and Plan:  1. Persistent afib She returns today for follow-up on flecainide  BID She continues to have afib and is not tolerating her medicines well.  Further changes are limited by bradycardia On eliquis (chads2vasc score is 2) Therapeutic strategies for afib including medicine and ablation were discussed in detail with the patient today. Risk, benefits, and alternatives to EP study and radiofrequency ablation for afib were also discussed in detail today. These risks include but are not limited to stroke, bleeding, vascular damage, tamponade, perforation, damage to the esophagus, lungs, and other structures, pulmonary vein stenosis, worsening renal function, and death. The patient understands these risk and wishes to proceed.  We will therefore proceed.  Cardiac CT results are reviewed at length today.  She reports compliance with eliquis without interruption.  Hillis Range MD, [facility] 08/12/2017 7:11 AM

## 2017-08-12 NOTE — Progress Notes (Addendum)
Site area: RFV x 3 Site Prior to Removal:  Level 0 Pressure Applied For: 20 min Manual:   yes Patient Status During Pull:   Post Pull Site:  Level Post Pull Instructions Given:  yes Post Pull Pulses Present: palpable Dressing Applied:  Clear/gauze Bedrest begins @ 1130 till 1730 Comments:

## 2017-08-12 NOTE — Discharge Summary (Addendum)
ELECTROPHYSIOLOGY PROCEDURE DISCHARGE SUMMARY    Patient ID: Desiree Everett,  MRN: [id number], DOB/AGE: March 01, 1946 72 y.o.  Admit date: 08/12/2017 Discharge date: 08/13/2017  Primary Care Physician: Romeo Rabon, MD Primary Cardiologist: Rockne Menghini Electrophysiologist: Hillis Range, MD  Primary Discharge Diagnosis:  Persistent atrial fibrillation status post ablation this admission  Secondary Discharge Diagnosis:  1.  Hypothyroidism 2.  Hyperlipidemia 3.  GERD 4.  Osteoporosis 5.  Seizure disorder  Procedures This Admission:  1.  Electrophysiology study and radiofrequency catheter ablation on 08/12/17 by Dr Hillis Range.  This study demonstrated sinus rhythm upon presentation; intracardiac echo reveals a moderate sized left atrium with four separate pulmonary veins without evidence of pulmonary vein stenosis; successful electrical isolation and anatomical encircling of all four pulmonary veins with radiofrequency current; no inducible arrhythmias following ablation both on and off of Isuprel; no early apparent complications.  Brief HPI: Desiree Everett is a 72 y.o. female with a history of persistent atrial fibrillation.  They have failed medical therapy with Flecainide. Risks, benefits, and alternatives to catheter ablation of atrial fibrillation were reviewed with the patient who wished to proceed.  The patient underwent cardiac CT prior to the procedure which demonstrated no LAA thrombus.    Hospital Course:  The patient was admitted and underwent EPS/RFCA of atrial fibrillation with details as outlined above.  They were monitored on telemetry overnight which demonstrated sinus rhythm.  Groin was without complication on the day of discharge.  The patient was examined and considered to be stable for discharge.  Wound care and restrictions were reviewed with the patient.  The patient will be seen back by Rudi Coco, NP in 4 weeks and Dr Johney Frame in 12 weeks for post  ablation follow up.   This patients CHA2DS2-VASc Score and unadjusted Ischemic Stroke Rate (% per year) is equal to 2.2 % stroke rate/year from a score of 2 Above score calculated as 1 point each if present [CHF, HTN, DM, Vascular=MI/PAD/Aortic Plaque, Age if 65-74, or Female] Above score calculated as 2 points each if present [Age > 75, or Stroke/TIA/TE]   Physical Exam: Vitals:   08/12/17 1700 08/12/17 1939 08/12/17 2000 08/13/17 0647  BP: 120/62 (!) 110/58  111/62  Pulse: 73 83  72  Resp: (!) 22 17 (!) 21 16  Temp:  98.3 F (36.8 C)  97.9 F (36.6 C)  TempSrc:  Oral  Oral  SpO2: 96% 93%  97%  Weight:    141 lb 1.5 oz (64 kg)  Height:        GEN- The patient is well appearing, alert and oriented x 3 today.   HEENT: normocephalic, atraumatic; sclera clear, conjunctiva pink; hearing intact; oropharynx clear; neck supple  Lungs- Clear to ausculation bilaterally, normal work of breathing.  No wheezes, rales, rhonchi Heart- Regular rate and rhythm, no murmurs, rubs or gallops  GI- soft, non-tender, non-distended, bowel sounds present  Extremities- no clubbing, cyanosis, or edema; DP/PT/radial pulses 2+ bilaterally, groin without hematoma/bruit MS- no significant deformity or atrophy Skin- warm and dry, no rash or lesion Psych- euthymic mood, full affect Neuro- strength and sensation are intact   Labs:   Lab Results  Component Value Date   WBC 3.6 08/06/2017   HGB 12.4 08/06/2017   HCT 37.5 08/06/2017   MCV 95 08/06/2017   PLT 149 (L) 08/06/2017    Recent Labs  Lab 08/06/17 1104  NA 143  K 4.6  CL 103  CO2 26  BUN 15  CREATININE 0.84  CALCIUM 9.5  GLUCOSE 84     Discharge Medications:  Allergies as of 08/13/2017   No Known Allergies     Medication List    TAKE these medications   acetaminophen 650 MG CR tablet Commonly known as:  TYLENOL Take 1,300 mg by mouth daily as needed for pain.   atorvastatin 10 MG tablet Commonly known as:  LIPITOR Take 10  mg by mouth at bedtime.   cholecalciferol 1000 units tablet Commonly known as:  VITAMIN D Take 1,000 Units by mouth daily.   denosumab 60 MG/ML Soln injection Commonly known as:  PROLIA Inject 60 mg into the skin every 6 (six) months. Administer in upper arm, thigh, or abdomen   ELIQUIS 5 MG Tabs tablet Generic drug:  apixaban Take 5 mg by mouth 2 (two) times daily.   esomeprazole 40 MG capsule Commonly known as:  NEXIUM Take 40 mg by mouth daily.   flecainide 100 MG tablet Commonly known as:  TAMBOCOR TAKE 1 TABLET BY MOUTH TWICE DAILY   fluticasone 50 MCG/ACT nasal spray Commonly known as:  FLONASE Place 1 spray into both nostrils daily as needed for allergies or rhinitis.   levETIRAcetam 500 MG tablet Commonly known as:  KEPPRA Take 500 mg by mouth 2 (two) times daily.   levothyroxine 50 MCG tablet Commonly known as:  SYNTHROID, LEVOTHROID Take 50 mcg by mouth at bedtime.   metoprolol succinate 25 MG 24 hr tablet Commonly known as:  TOPROL-XL Take 6.25 mg by mouth daily.   multivitamin capsule Take 1 capsule by mouth daily.   SALONPAS PAIN RELIEF PATCH EX Apply 1 patch topically daily as needed (pain).       Disposition:  Discharge Instructions    Diet - low sodium heart healthy   Complete by:  As directed    Increase activity slowly   Complete by:  As directed      Follow-up Information    [facility] Follow up on 09/09/2017.   Specialty:  Cardiology Why:  at [facility] information: [address] [id number] [place] [address] [phone]       Hillis Range, MD Follow up on 11/17/2017.   Specialty:  Cardiology Why:  at 12noon Contact information: [address] [place] [address] [phone]           Duration of Discharge Encounter: Greater than 30 minutes including physician time.  Signed, Gypsy Balsam, NP 08/13/2017 7:41 AM   I have seen, examined the  patient, and reviewed the above assessment and plan.  Changes to above are made where necessary.  On exam, RRR.  Doing well s/p ablation DC to home.  Routine follow-up.  Co Sign: Hillis Range, MD 08/13/2017 11:01 PM

## 2017-08-12 NOTE — Anesthesia Procedure Notes (Signed)
Procedure Name: Intubation Date/Time: 08/12/2017 7:46 AM Performed by: Clearnce Sorrel, CRNA Pre-anesthesia Checklist: Patient identified, Emergency Drugs available, Suction available, Patient being monitored and Timeout performed Patient Re-evaluated:Patient Re-evaluated prior to induction Oxygen Delivery Method: Circle system utilized Preoxygenation: Pre-oxygenation with 100% oxygen Induction Type: IV induction Ventilation: Mask ventilation without difficulty Laryngoscope Size: Mac and 3 Grade View: Grade I Tube type: Oral Tube size: 7.0 mm Number of attempts: 1 Airway Equipment and Method: Stylet Placement Confirmation: ETT inserted through vocal cords under direct vision,  positive ETCO2 and breath sounds checked- equal and bilateral Secured at: 21 cm Tube secured with: Tape Dental Injury: Teeth and Oropharynx as per pre-operative assessment

## 2017-08-12 NOTE — Transfer of Care (Signed)
Immediate Anesthesia Transfer of Care Note  Patient: Desiree Everett  Procedure(s) Performed: ATRIAL FIBRILLATION ABLATION (N/A )  Patient Location: Cath Lab  Anesthesia Type:General  Level of Consciousness: awake, alert  and oriented  Airway & Oxygen Therapy: Patient Spontanous Breathing and Patient connected to nasal cannula oxygen  Post-op Assessment: Report given to RN and Post -op Vital signs reviewed and stable  Post vital signs: Reviewed and stable  Last Vitals:  Vitals Value Taken Time  BP 134/55 08/12/2017 10:28 AM  Temp 36.5 C 08/12/2017 10:26 AM  Pulse 81 08/12/2017 10:28 AM  Resp 11 08/12/2017 10:28 AM  SpO2 92 % 08/12/2017 10:28 AM  Vitals shown include unvalidated device data.  Last Pain:  Vitals:   08/12/17 1026  TempSrc: Temporal  PainSc: 0-No pain      Patients Stated Pain Goal: 6 (08/12/17 3295)  Complications: No apparent anesthesia complications

## 2017-08-12 NOTE — Discharge Instructions (Signed)
No driving for 4 days. No lifting over 5 lbs for 1 week. No sexual activity for 1 week. You may return to work in 1 week. Keep procedure site clean & dry. If you notice increased pain, swelling, bleeding or pus, call/return!  You may shower, but no soaking baths/hot tubs/pools for 1 week.  ° ° °You have an appointment set up with the Atrial Fibrillation Clinic.  Multiple studies have shown that being followed by a dedicated atrial fibrillation clinic in addition to the standard care you receive from your other physicians improves health. We believe that enrollment in the atrial fibrillation clinic will allow us to better care for you.  ° °The phone number to the Atrial Fibrillation Clinic is [phone]. The clinic is staffed Monday through Friday from 8:30am to 5pm. ° °Parking Directions: The clinic is located in the Heart and Vascular Building connected to [facility]. °1)From [address] turn on to [address] and go to the 3rd entrance  (Heart and Vascular entrance) on the right. °2)Look to the right for Heart &Vascular Parking Garage. °3)A code for the entrance is required please call the clinic to receive this.   °4)Take the elevators to the 1st floor. Registration is in the room with the glass walls at the end of the hallway. ° °If you have any trouble parking or locating the clinic, please don’t hesitate to call [phone]. ° ° °

## 2017-08-12 NOTE — Anesthesia Postprocedure Evaluation (Signed)
Anesthesia Post Note  Patient: Desiree Everett Ewing Nijjar  Procedure(s) Performed: ATRIAL FIBRILLATION ABLATION (N/A )     Patient location during evaluation: Cath Lab Anesthesia Type: General Level of consciousness: awake Pain management: pain level controlled Vital Signs Assessment: post-procedure vital signs reviewed and stable Respiratory status: spontaneous breathing Cardiovascular status: stable Postop Assessment: no apparent nausea or vomiting Anesthetic complications: no    Last Vitals:  Vitals:   08/12/17 1100 08/12/17 1103  BP:  139/74  Pulse: 85 80  Resp: 11 (!) 9  Temp:    SpO2: 99% 98%    Last Pain:  Vitals:   08/12/17 1050  TempSrc: Temporal  PainSc:    Pain Goal: Patients Stated Pain Goal: 6 (08/12/17 0609)               Gailya Tauer JR,JOHN Susann Givens

## 2017-08-13 DIAGNOSIS — E785 Hyperlipidemia, unspecified: Secondary | ICD-10-CM | POA: Diagnosis not present

## 2017-08-13 DIAGNOSIS — I481 Persistent atrial fibrillation: Secondary | ICD-10-CM | POA: Diagnosis not present

## 2017-08-13 DIAGNOSIS — I480 Paroxysmal atrial fibrillation: Secondary | ICD-10-CM

## 2017-08-13 DIAGNOSIS — E039 Hypothyroidism, unspecified: Secondary | ICD-10-CM | POA: Diagnosis not present

## 2017-08-14 LAB — POCT ACTIVATED CLOTTING TIME: ACTIVATED CLOTTING TIME: 417 s

## 2017-09-09 ENCOUNTER — Encounter (HOSPITAL_COMMUNITY): Payer: Self-pay | Admitting: Nurse Practitioner

## 2017-09-09 ENCOUNTER — Ambulatory Visit (HOSPITAL_COMMUNITY)
Admission: RE | Admit: 2017-09-09 | Discharge: 2017-09-09 | Disposition: A | Discharge status: Home / Self Care | Payer: Medicare Other | Source: Ambulatory Visit | Attending: Nurse Practitioner | Admitting: Nurse Practitioner

## 2017-09-09 VITALS — BP 134/84 | HR 67 | Ht 63.0 in | Wt 140.0 lb

## 2017-09-09 DIAGNOSIS — Z79890 Hormone replacement therapy (postmenopausal): Secondary | ICD-10-CM | POA: Diagnosis not present

## 2017-09-09 DIAGNOSIS — E785 Hyperlipidemia, unspecified: Secondary | ICD-10-CM | POA: Diagnosis not present

## 2017-09-09 DIAGNOSIS — K219 Gastro-esophageal reflux disease without esophagitis: Secondary | ICD-10-CM | POA: Diagnosis not present

## 2017-09-09 DIAGNOSIS — Z79899 Other long term (current) drug therapy: Secondary | ICD-10-CM | POA: Insufficient documentation

## 2017-09-09 DIAGNOSIS — Z7901 Long term (current) use of anticoagulants: Secondary | ICD-10-CM | POA: Insufficient documentation

## 2017-09-09 DIAGNOSIS — I480 Paroxysmal atrial fibrillation: Secondary | ICD-10-CM | POA: Diagnosis present

## 2017-09-09 DIAGNOSIS — E039 Hypothyroidism, unspecified: Secondary | ICD-10-CM | POA: Diagnosis not present

## 2017-09-09 NOTE — Progress Notes (Signed)
Primary Care Physician: Romeo Rabonaplan, Michael, MD Primary Cardiologist: Rockne MenghiniZakhary Primary Electrophysiologist: Neale BurlyAllred  Desiree Everett is a 72 y.o. female with a history of persistent atrial fibrillation who [facility]  Since last being seen in clinic, the patient reports doing very well.  She has no known AF since ablation. She has had 2 bladder infections for which she has been on antibiotics. She reports compliance with Flecainide and Eliquis with no missed doses. No procedure complications.   Today, she denies symptoms of palpitations, chest pain, shortness of breath, orthopnea, PND, lower extremity edema, dizziness, presyncope, syncope, snoring, daytime somnolence, bleeding, or neurologic sequela. The patient is tolerating medications without difficulties and is otherwise without complaint today.    Atrial Fibrillation Risk Factors:  she does not have symptoms or diagnosis of sleep apnea.  she does not have a history of rheumatic fever.  she does not have a history of alcohol use.  she has a BMI of Body mass index is 24.8 kg/m.Marland Kitchen. Filed Weights   09/09/17 1007  Weight: 140 lb (63.5 kg)    Atrial Fibrillation Management history:  Previous antiarrhythmic drugs: Flecainide  Previous cardioversions: none  Previous ablations: 2019  CHADS2VASC score: 1  Anticoagulation history: Eliquis   Past Medical History:  Diagnosis Date  . Arthritis    "I'm sure I have some in my back" (08/12/2017)  . Childhood asthma   . Chronic lower back pain   . GERD (gastroesophageal reflux disease)   . History of hiatal hernia   . History of kidney stones X 1  . Hyperlipidemia   . Hypothyroidism   . Osteoporosis   . Paroxysmal atrial fibrillation (HCC)   . Scoliosis   . Seizure disorder [facility](HCC)    no seizures since age [age]s, on keppra chronically (08/12/2017)   Past Surgical History:  Procedure Laterality Date  . ATRIAL FIBRILLATION  ABLATION N/A 08/12/2017   Procedure: ATRIAL FIBRILLATION ABLATION;  Surgeon: Hillis RangeAllred, James, MD;  Location: [facility] INVASIVE CV LAB;  Service: Cardiovascular;  Laterality: N/A;  . CARDIAC CATHETERIZATION  01/2017  . TONSILLECTOMY    . VAGINAL HYSTERECTOMY      Current Outpatient Medications  Medication Sig Dispense Refill  . acetaminophen (TYLENOL) 650 MG CR tablet Take 1,300 mg by mouth daily as needed for pain.    Marland Kitchen. atorvastatin (LIPITOR) 10 MG tablet Take 10 mg by mouth at bedtime.  11  . cholecalciferol (VITAMIN D) 1000 units tablet Take 1,000 Units by mouth daily.    Marland Kitchen. denosumab (PROLIA) 60 MG/ML SOLN injection Inject 60 mg into the skin every 6 (six) months. Administer in upper arm, thigh, or abdomen    . ELIQUIS 5 MG TABS tablet Take 5 mg by mouth 2 (two) times daily.     Marland Kitchen. esomeprazole (NEXIUM) 40 MG capsule Take 40 mg by mouth daily.  5  . flecainide (TAMBOCOR) 100 MG tablet TAKE 1 TABLET BY MOUTH TWICE DAILY 180 tablet 6  . fluticasone (FLONASE) 50 MCG/ACT nasal spray Place 1 spray into both nostrils daily as needed for allergies or rhinitis.    Marland Kitchen. levETIRAcetam (KEPPRA) 500 MG tablet Take 500 mg by mouth 2 (two) times daily.    Marland Kitchen. levothyroxine (SYNTHROID, LEVOTHROID) 50 MCG tablet Take 50 mcg by mouth at bedtime.   11  . Liniments (SALONPAS PAIN RELIEF PATCH EX) Apply 1 patch topically daily as needed (pain).    . metoprolol succinate (TOPROL-XL) 25 MG  24 hr tablet Take 6.25 mg by mouth daily.     . Multiple Vitamin (MULTIVITAMIN) capsule Take 1 capsule by mouth daily.    Marland Kitchen sulfamethoxazole-trimethoprim (BACTRIM DS,SEPTRA DS) 800-160 MG tablet      No current facility-administered medications for this encounter.     No Known Allergies  Social History   Socioeconomic History  . Marital status: Married    Spouse name: Not on file  . Number of children: Not on file  . Years of education: Not on file  . Highest education level: Not on file  Occupational History  . Not on file    Social Needs  . Financial resource strain: Not on file  . Food insecurity:    Worry: Not on file    Inability: Not on file  . Transportation needs:    Medical: Not on file    Non-medical: Not on file  Tobacco Use  . Smoking status: Never Smoker  . Smokeless tobacco: Never Used  Substance and Sexual Activity  . Alcohol use: No    Frequency: Never  . Drug use: No  . Sexual activity: Yes  Lifestyle  . Physical activity:    Days per week: Not on file    Minutes per session: Not on file  . Stress: Not on file  Relationships  . Social connections:    Talks on phone: Not on file    Gets together: Not on file    Attends religious service: Not on file    Active member of club or organization: Not on file    Attends meetings of clubs or organizations: Not on file    Relationship status: Not on file  . Intimate partner violence:    Fear of current or ex partner: Not on file    Emotionally abused: Not on file    Physically abused: Not on file    Forced sexual activity: Not on file  Other Topics Concern  . Not on file  Social History Narrative   Lives in [place]   Previously owned a salon    Family History  Problem Relation Age of Onset  . Arrhythmia Mother   . Heart attack Father     ROS- All systems are reviewed and negative except as per the HPI above.  Physical Exam: Vitals:   09/09/17 1007  BP: 134/84  Pulse: 67  Weight: 140 lb (63.5 kg)  Height: 5\' 3"  (1.6 m)    GEN- The patient is well appearing, alert and oriented x 3 today.   Head- normocephalic, atraumatic Eyes-  Sclera clear, conjunctiva pink Ears- hearing intact Oropharynx- clear Neck- supple  Lungs- Clear to ausculation bilaterally, normal work of breathing Heart- Regular rate and rhythm  GI- soft, NT, ND, + BS Extremities- no clubbing, cyanosis, or edema MS- no significant deformity or atrophy Skin- no rash or lesion Psych- euthymic mood, full affect Neuro- strength and sensation are  intact  Wt Readings from Last 3 Encounters:  09/09/17 140 lb (63.5 kg)  08/13/17 141 lb 1.5 oz (64 kg)  07/04/17 142 lb (64.4 kg)    EKG today demonstrates SR, rate 67, normal intervals  Epic records are reviewed at length today  Assessment and Plan:  1. Paroxysmal atrial fibrillation Doing well post ablation No recurrence by symptoms today Discussed stopping Flecainide, will continue for now until seen by Dr Johney Frame at 3 month visit Discussed importance of OAC compliance  No procedure related complications   Follow up with Dr  Allred as scheduled   Gypsy Balsam, NP 09/09/2017 10:31 AM

## 2017-09-10 ENCOUNTER — Ambulatory Visit: Payer: Medicare Other | Admitting: Internal Medicine

## 2017-09-16 ENCOUNTER — Telehealth: Payer: Self-pay | Admitting: Nurse Practitioner

## 2017-09-16 ENCOUNTER — Other Ambulatory Visit: Payer: Self-pay | Admitting: Internal Medicine

## 2017-09-16 NOTE — Telephone Encounter (Signed)
rx confirmed with pharmacy. Pt take 1/4 tablet once a day

## 2017-09-16 NOTE — Telephone Encounter (Signed)
Follow Up:  quest     Please call,need instructions on pt's Metoprolol.

## 2017-09-16 NOTE — Telephone Encounter (Signed)
This is a A-Fib pt 

## 2017-11-17 ENCOUNTER — Encounter: Payer: Self-pay | Admitting: Internal Medicine

## 2017-11-17 ENCOUNTER — Ambulatory Visit (INDEPENDENT_AMBULATORY_CARE_PROVIDER_SITE_OTHER): Payer: Medicare Other | Admitting: Internal Medicine

## 2017-11-17 VITALS — BP 128/76 | HR 73 | Ht 63.0 in | Wt 141.8 lb

## 2017-11-17 DIAGNOSIS — I4819 Other persistent atrial fibrillation: Secondary | ICD-10-CM

## 2017-11-17 DIAGNOSIS — I481 Persistent atrial fibrillation: Secondary | ICD-10-CM

## 2017-11-17 MED ORDER — FLECAINIDE ACETATE 100 MG PO TABS: ORAL_TABLET | ORAL | 0 refills | Status: DC

## 2017-11-17 NOTE — Progress Notes (Signed)
PCP: Romeo Rabonaplan, Michael, MD Primary Cardiologist: Dr Webb SilversmithZachary  Desiree Everett Desiree Everett is a 72 y.o. female who presents today for routine electrophysiology followup.  Since his recent afib ablation, the patient reports doing very well.  she denies procedure related complications and is pleased with the results of the procedure.  Today, she denies symptoms of palpitations, chest pain, shortness of breath,  lower extremity edema, dizziness, presyncope, or syncope.  The patient is otherwise without complaint today.   Past Medical History:  Diagnosis Date  . Arthritis    "I'm sure I have some in my back" (08/12/2017)  . Childhood asthma   . Chronic lower back pain   . GERD (gastroesophageal reflux disease)   . History of hiatal hernia   . History of kidney stones X 1  . Hyperlipidemia   . Hypothyroidism   . Osteoporosis   . Paroxysmal atrial fibrillation (HCC)   . Scoliosis   . Seizure disorder [facility](HCC)    no seizures since age [age]s, on keppra chronically (08/12/2017)   Past Surgical History:  Procedure Laterality Date  . ATRIAL FIBRILLATION ABLATION N/A 08/12/2017   Procedure: ATRIAL FIBRILLATION ABLATION;  Surgeon: Hillis RangeAllred, Neddie Steedman, MD;  [facility];  Service: Cardiovascular;  Laterality: N/A;  . CARDIAC CATHETERIZATION  01/2017  . TONSILLECTOMY    . VAGINAL HYSTERECTOMY      ROS- all systems are personally reviewed and negatives except as per HPI above  Current Outpatient Medications  Medication Sig Dispense Refill  . acetaminophen (TYLENOL) 650 MG CR tablet Take 1,300 mg by mouth daily as needed for pain.    Marland Kitchen. atorvastatin (LIPITOR) 10 MG tablet Take 10 mg by mouth at bedtime.  11  . cholecalciferol (VITAMIN D) 1000 units tablet Take 1,000 Units by mouth daily.    Marland Kitchen. denosumab (PROLIA) 60 MG/ML SOLN injection Inject 60 mg into the skin every 6 (six) months. Administer in upper arm, thigh, or abdomen    . ELIQUIS 5 MG TABS tablet Take 5 mg by mouth 2 (two) times daily.     Marland Kitchen.  esomeprazole (NEXIUM) 40 MG capsule Take 40 mg by mouth daily.  5  . flecainide (TAMBOCOR) 100 MG tablet TAKE 1 TABLET BY MOUTH TWICE DAILY 180 tablet 6  . levETIRAcetam (KEPPRA) 500 MG tablet Take 500 mg by mouth 2 (two) times daily.    Marland Kitchen. levothyroxine (SYNTHROID, LEVOTHROID) 50 MCG tablet Take 50 mcg by mouth at bedtime.   11  . Liniments (SALONPAS PAIN RELIEF PATCH EX) Apply 1 patch topically daily as needed (pain).    . metoprolol succinate (TOPROL-XL) 25 MG 24 hr tablet Take 6.25mg  once a day by mouth 30 tablet 1  . Multiple Vitamin (MULTIVITAMIN) capsule Take 1 capsule by mouth daily.     No current facility-administered medications for this visit.     Physical Exam: Vitals:   11/17/17 1307  BP: 128/76  Pulse: 73  SpO2: 97%  Weight: 141 lb 12.8 oz (64.3 kg)  Height: 5\' 3"  (1.6 m)    GEN- The patient is well appearing, alert and oriented x 3 today.   Head- normocephalic, atraumatic Eyes-  Sclera clear, conjunctiva pink Ears- hearing intact Oropharynx- clear Lungs- Clear to ausculation bilaterally, normal work of breathing Heart- Regular rate and rhythm, no murmurs, rubs or gallops, PMI not laterally displaced GI- soft, NT, ND, + BS Extremities- no clubbing, cyanosis, or edema  EKG tracing ordered today is personally reviewed and shows sinus rhythm 73 bpm,  PR 190 msec, QRS 128 msec, nonspecific ST/T changes  Assessment and Plan:  1. Persistent atrial fibrillation Doing well s/p ablation chads2vasc score is 2.  Continue anticoagulation for now.  Given 2019 updates on AF management, would be reasonable to stop OAC at this time.  We did discuss this and will likely stop on return in 3 months Wean off flecainide, then stop toprol.  Return to see me in 3 months  Hillis RangeJames Jaken Fregia MD, [facility] 11/17/2017 1:12 PM

## 2017-11-17 NOTE — Patient Instructions (Signed)
Medication Instructions:  Your physician has recommended you make the following change in your medication:  1.  Decrease your flecainide 100 mg- Take 1/2 tablet by mouth twice a day for 1 week and then STOP 2.  In 4 weeks (December 15, 2017) stop taking Toprol XL.   Labwork: None ordered.  Testing/Procedures: None ordered.  Follow-Up: Your physician wants you to follow-up in: 3 months with Dr. Johney FrameAllred.   Any Other Special Instructions Will Be Listed Below (If Applicable).  If you need a refill on your cardiac medications before your next appointment, please call your pharmacy.

## 2018-01-05 ENCOUNTER — Other Ambulatory Visit: Payer: Self-pay | Admitting: Internal Medicine

## 2018-01-13 ENCOUNTER — Other Ambulatory Visit: Payer: Self-pay | Admitting: Internal Medicine

## 2018-01-13 NOTE — Telephone Encounter (Signed)
Pt last saw Dr Johney Frame 11/17/17, last labs 08/06/17 Creat 0.84, age 72, weight 64.3, based on specified criteria pt is on appropriate dosage of Eliquis 5mg  BID.  Will refill rx.

## 2018-02-18 ENCOUNTER — Encounter: Payer: Self-pay | Admitting: Internal Medicine

## 2018-02-18 ENCOUNTER — Ambulatory Visit (INDEPENDENT_AMBULATORY_CARE_PROVIDER_SITE_OTHER): Payer: Medicare Other | Admitting: Internal Medicine

## 2018-02-18 VITALS — BP 142/88 | HR 79 | Ht 63.0 in | Wt 140.4 lb

## 2018-02-18 DIAGNOSIS — I4819 Other persistent atrial fibrillation: Secondary | ICD-10-CM

## 2018-02-18 NOTE — Patient Instructions (Addendum)
Medication Instructions:  Your physician recommends that you continue on your current medications as directed. Please refer to the Current Medication list given to you today.  Labwork: None ordered.  Testing/Procedures: None ordered.  Follow-Up: Your physician wants you to follow-up in: 4 months with Dr. Allred.      Any Other Special Instructions Will Be Listed Below (If Applicable).  If you need a refill on your cardiac medications before your next appointment, please call your pharmacy.   

## 2018-02-18 NOTE — Progress Notes (Signed)
PCP: Romeo Rabonaplan, Michael, MD Primary Cardiologist: Dr Earna CoderZachary Primary EP: Dr Neale BurlyAllred  Desiree Everett is a 72 y.o. female who presents today for routine electrophysiology followup.  Since last being seen in our clinic, the patient reports doing very well.  Today, she denies symptoms of palpitations, chest pain, shortness of breath,  lower extremity edema, dizziness, presyncope, or syncope.  The patient is otherwise without complaint today.   Past Medical History:  Diagnosis Date  . Arthritis    "I'm sure I have some in my back" (08/12/2017)  . Childhood asthma   . Chronic lower back pain   . GERD (gastroesophageal reflux disease)   . History of hiatal hernia   . History of kidney stones X 1  . Hyperlipidemia   . Hypothyroidism   . Osteoporosis   . Paroxysmal atrial fibrillation (HCC)   . Scoliosis   . Seizure disorder [facility](HCC)    no seizures since age 3920s, on keppra chronically (08/12/2017)   Past Surgical History:  Procedure Laterality Date  . ATRIAL FIBRILLATION ABLATION N/A 08/12/2017   Procedure: ATRIAL FIBRILLATION ABLATION;  Surgeon: Hillis RangeAllred, Shellsea Borunda, MD;  [facility];  Service: Cardiovascular;  Laterality: N/A;  . CARDIAC CATHETERIZATION  01/2017  . TONSILLECTOMY    . VAGINAL HYSTERECTOMY      ROS- all systems are reviewed and negatives except as per HPI above  Current Outpatient Medications  Medication Sig Dispense Refill  . acetaminophen (TYLENOL) 650 MG CR tablet Take 1,300 mg by mouth daily as needed for pain.    Marland Kitchen. atorvastatin (LIPITOR) 10 MG tablet Take 10 mg by mouth at bedtime.  11  . cholecalciferol (VITAMIN D) 1000 units tablet Take 1,000 Units by mouth daily.    Marland Kitchen. denosumab (PROLIA) 60 MG/ML SOLN injection Inject 60 mg into the skin every 6 (six) months. Administer in upper arm, thigh, or abdomen    . ELIQUIS 5 MG TABS tablet TAKE 1 TABLET BY MOUTH TWICE DAILY 60 tablet 6  . esomeprazole (NEXIUM) 40 MG capsule Take 40 mg by mouth daily.  5  .  levETIRAcetam (KEPPRA) 500 MG tablet Take 500 mg by mouth 2 (two) times daily.    Marland Kitchen. levothyroxine (SYNTHROID, LEVOTHROID) 50 MCG tablet Take 50 mcg by mouth at bedtime.   11  . Liniments (SALONPAS PAIN RELIEF PATCH EX) Apply 1 patch topically daily as needed (pain).    . Multiple Vitamin (MULTIVITAMIN) capsule Take 1 capsule by mouth daily.    . flecainide (TAMBOCOR) 100 MG tablet Decrease to 1/2 tablet bid x 1 week and then stop 4 tablet 0   No current facility-administered medications for this visit.     Physical Exam: Vitals:   02/18/18 1140  BP: (!) 142/88  Pulse: 79  SpO2: 99%  Weight: 140 lb 6.4 oz (63.7 kg)  Height: 5\' 3"  (1.6 m)    GEN- The patient is well appearing, alert and oriented x 3 today.   Head- normocephalic, atraumatic Eyes-  Sclera clear, conjunctiva pink Ears- hearing intact Oropharynx- clear Lungs- Clear to ausculation bilaterally, normal work of breathing Heart- Regular rate and rhythm, no murmurs, rubs or gallops, PMI not laterally displaced GI- soft, NT, ND, + BS Extremities- no clubbing, cyanosis, or edema  Wt Readings from Last 3 Encounters:  02/18/18 140 lb 6.4 oz (63.7 kg)  11/17/17 141 lb 12.8 oz (64.3 kg)  09/09/17 140 lb (63.5 kg)    EKG tracing ordered today is personally reviewed and shows  sinus rhythm 70 bpm, PR 142 msec, QRS 78 msec, QTc 444 msec, LVH, nonspecific ST/T changes  Assessment and Plan:  1. Persistent afib Doing very well s/p ablation off AAD therapy chads2vasc score is 2.  Continue anticoagulation (we discussed pros and cons of anticoagulation at risk today with patient.  We also discussed guidelines update.  She prefers to continue eliquis for now).  Return in 4 months  Hillis Range MD, [facility] 02/18/2018 12:19 PM

## 2018-02-24 ENCOUNTER — Telehealth: Payer: Self-pay | Admitting: *Deleted

## 2018-02-24 NOTE — Telephone Encounter (Signed)
   [facility] Pre-operative Risk Assessment    Request for surgical clearance:  1. What type of surgery is being performed? COLONOSCOPY   2. When is this surgery scheduled? 05/06/18   3. Are there any medications that need to be held prior to surgery and how long? ELIQUIS X 3 DAYS   4. Practice name and name of physician performing surgery? [facility] [place]; DR. Vilinda Blanks   5. What is your office phone and fax number? PH# [phone]; FAX# [phone]   6. Anesthesia type (None, local, MAC, general) ? PROPOFOL    Julaine Hua 02/24/2018, 12:59 PM  _________________________________________________________________   (provider comments below)

## 2018-02-25 NOTE — Telephone Encounter (Signed)
Patient with diagnosis of atrial fibrillation on Eliquis for anticoagulation.    Procedure: colonoscopy Date of procedure: 05/06/2018  CHADS2-VASc score of  2 ( AGE, , female)  CrCl 60 Platelet count 149  Per office protocol, patient can hold Eliquis for 1 day prior to procedure.    Patient should restart Eliquis on the evening of procedure or day after, at discretion of procedure MD

## 2018-03-03 NOTE — Telephone Encounter (Signed)
   Primary Cardiologist: Dr Earna CoderZachary Primary EP: Dr Johney FrameAllred  Chart reviewed as part of pre-operative protocol coverage. Patient was contacted 03/03/2018 in reference to pre-operative risk assessment for pending surgery as outlined below. H/o GERD, HLD, persistent AF s/p ablation. Aggie Cosierheresa Ewing Ehly was last seen on 02/18/18 by Dr. Johney FrameAllred and doing well. I spoke with the patient who affirms she is doing well without new cardiac complaint. Therefore, based on ACC/AHA guidelines, the patient would be at acceptable risk for the planned procedure without further cardiovascular testing.   Per pharmacist Dr. Johnanna SchneidersAlvstad, "Per office protocol, patient can hold Eliquis for 1 day prior to procedure. Patient should restart Eliquis on the evening of procedure or day after, at discretion of procedure MD." Since GI had requested to hold Eliquis for 3 days I did take the chart to our pharmacist Dr. Rennis ChrisSupple for re-look who felt a range of 1-2 days would be appropriate for this patient, but agreed 3 days is felt excessive.  I will route this recommendation to the requesting party via Epic fax function and remove from pre-op pool.  Please call with questions.  Laurann Montanaayna N Dunn, PA-C 03/03/2018, 2:42 PM

## 2018-06-11 ENCOUNTER — Encounter: Payer: Self-pay | Admitting: Internal Medicine

## 2018-06-18 ENCOUNTER — Ambulatory Visit: Payer: Medicare Other | Admitting: Internal Medicine

## 2018-06-18 ENCOUNTER — Other Ambulatory Visit: Payer: Self-pay

## 2018-06-18 ENCOUNTER — Telehealth: Payer: Self-pay | Admitting: Internal Medicine

## 2018-06-18 ENCOUNTER — Telehealth (INDEPENDENT_AMBULATORY_CARE_PROVIDER_SITE_OTHER): Payer: Medicare Other | Admitting: Internal Medicine

## 2018-06-18 DIAGNOSIS — I480 Paroxysmal atrial fibrillation: Secondary | ICD-10-CM

## 2018-06-18 NOTE — Progress Notes (Signed)
Electrophysiology TeleHealth Note   Due to national recommendations of social distancing due to COVID 19, Audio/video telehealth visit is felt to be most appropriate for this patient at this time.  We obtained verbal consent for the patient's consent to telehealth for [facility] today.   Date:  06/18/2018   ID:  Desiree Everett 05-27-1945, MRN [id number]  Location: home  Provider location: [address] [place] Evaluation Performed: Follow-up visit  PCP:  Romeo Rabon, MD  Cardiologist:  Dr Earna Coder Primary Electrophysiologist: Hillis Range, MD    CC: afib  Desiree Everett is a 73 y.o. female who presents via audio/video conferencing for a telehealth visit today.  Since last being seen in our clinic, the patient reports doing very well.  Today, she denies symptoms of palpitations, chest pain, shortness of breath,  lower extremity edema, dizziness, presyncope, or syncope.  The patient is otherwise without complaint today.  The patient denies symptoms of fevers, chills, cough, or new SOB worrisome for COVID 19.   Past Medical History:  Diagnosis Date  . Arthritis    "I'm sure I have some in my back" (08/12/2017)  . Childhood asthma   . Chronic lower back pain   . GERD (gastroesophageal reflux disease)   . History of hiatal hernia   . History of kidney stones X 1  . Hyperlipidemia   . Hypothyroidism   . Osteoporosis   . Paroxysmal atrial fibrillation (HCC)   . Scoliosis   . Seizure disorder [facility])    no seizures since age 51s, on keppra chronically (08/12/2017)   Past Surgical History:  Procedure Laterality Date  . ATRIAL FIBRILLATION ABLATION N/A 08/12/2017   Procedure: ATRIAL FIBRILLATION ABLATION;  Surgeon: Hillis Range, MD;  [facility];  Service: Cardiovascular;  Laterality: N/A;  . CARDIAC CATHETERIZATION  01/2017  . TONSILLECTOMY    . VAGINAL HYSTERECTOMY       Current Outpatient Medications  Medication Sig  Dispense Refill  . acetaminophen (TYLENOL) 650 MG CR tablet Take 1,300 mg by mouth daily as needed for pain.    Marland Kitchen atorvastatin (LIPITOR) 10 MG tablet Take 10 mg by mouth at bedtime.  11  . cholecalciferol (VITAMIN D) 1000 units tablet Take 1,000 Units by mouth daily.    Marland Kitchen denosumab (PROLIA) 60 MG/ML SOLN injection Inject 60 mg into the skin every 6 (six) months. Administer in upper arm, thigh, or abdomen    . ELIQUIS 5 MG TABS tablet TAKE 1 TABLET BY MOUTH TWICE DAILY 60 tablet 6  . esomeprazole (NEXIUM) 40 MG capsule Take 40 mg by mouth daily.  5  . flecainide (TAMBOCOR) 100 MG tablet Decrease to 1/2 tablet bid x 1 week and then stop 4 tablet 0  . levETIRAcetam (KEPPRA) 500 MG tablet Take 500 mg by mouth 2 (two) times daily.    Marland Kitchen levothyroxine (SYNTHROID, LEVOTHROID) 50 MCG tablet Take 50 mcg by mouth at bedtime.   11  . Liniments (SALONPAS PAIN RELIEF PATCH EX) Apply 1 patch topically daily as needed (pain).    . Multiple Vitamin (MULTIVITAMIN) capsule Take 1 capsule by mouth daily.     No current facility-administered medications for this visit.     Allergies:   Patient has no known allergies.   Social History:  The patient  reports that she has never smoked. She has never used smokeless tobacco. She reports that she does not drink alcohol or use drugs.   Family  History:  The patient's  family history includes Arrhythmia in her mother; Heart attack in her father.    ROS:  Please see the history of present illness.   All other systems are personally reviewed and negative.   Exam: Well appearing, alert and conversant, regular work of breathing,  good skin color  BP 108/85, HR 80 at 8:30 am   Recent Labs: 08/06/2017: BUN 15; Creatinine, Ser 0.84; Hemoglobin 12.4; Platelets 149; Potassium 4.6; Sodium 143  personally reviewed   Wt Readings from Last 3 Encounters:  02/18/18 140 lb 6.4 oz (63.7 kg)  11/17/17 141 lb 12.8 oz (64.3 kg)  09/09/17 140 lb (63.5 kg)      Other studies  personally reviewed: Additional studies/ records that were reviewed today include: my prior visit notes  Review of the above records today demonstrates: as above Prior radiographs: none pertitent  The patient presents wearable device technology report for my review today. On my review, the patient presents with Apple Watch results.  We discussed at length today.  ASSESSMENT AND PLAN:  1.  Paroxysmal atrial fibrillation Well controlled post ablation Stop eliquis (chads2vasc score is 2) as per guidelines  2. COVID 19 screen The patient denies symptoms of COVID 19 at this time.  The importance of social distancing was discussed today.  Follow-up:  Return to see me in 6 months  Current medicines are reviewed at length with the patient today.   The patient does not have concerns regarding her medicines.  The following changes were made today:  none  Labs/ tests ordered today include:  No orders of the defined types were placed in this encounter.    Patient Risk:  after full review of this patients clinical status, I feel that they are at moderate risk at this time.  Today, I have spent 18 minutes with the patient with telehealth technology discussing afib .    Signed, Hillis Range MD, [facility] 06/18/2018 10:57 AM   [facility] [address] [place] [address] [phone] (office) [phone] (fax)

## 2018-06-22 ENCOUNTER — Ambulatory Visit: Payer: Medicare Other | Admitting: Internal Medicine

## 2018-09-09 IMAGING — CT CT HEART MORPH/PULM VEIN W/ CM & W/O CA SCORE
3 of 9 series · 8 of 20 positions shown, 9 images · IV contrast (isovue)
Comparison: None.
COMPARISON: None

EXAM:
OVER-READ INTERPRETATION  CT CHEST

The following report is an over-read performed by radiologist Dr.
does not include interpretation of cardiac or coronary anatomy or
pathology. The coronary CTA interpretation by the cardiologist is
attached.
CLINICAL DATA: Pre Ablation
Cardiac Gated CTA
TECHNIQUE: The patient was scanned on a Siemens Force [REDACTED]ice scanner. Gantry
rotation speed was 250 msec with a temporal resolution of 66 msec. A
prospective scan was triggered in the ascending thoracic aorta at
140 HU's Data sets were reconstructed with full mA between 35% and
75% of the R-R interval Images were reviewed using VRT, MIP and MPR
modes. Double oblique images were used to measure the PV diameter
and areas. The patient received 80 cc of contrast at 5 cc/sec
CONTRAST:  Isovue 370 total 80 cc

[Series 8: ax thins · axial · 0.70mm/px · z∈[+1003,+1061]mm · 2 of 247 slices shown]
[im 83/247  vessel]
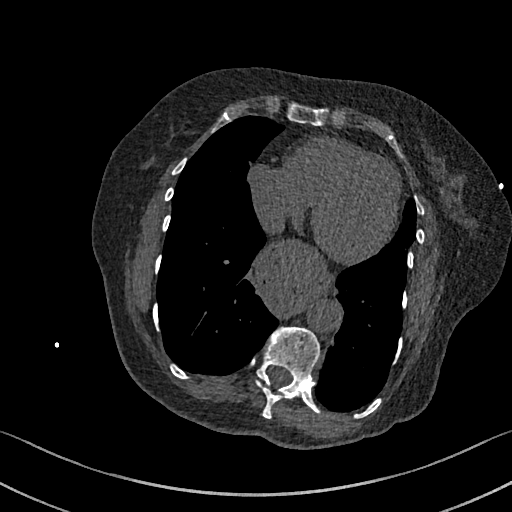
[im 165/247  vessel]
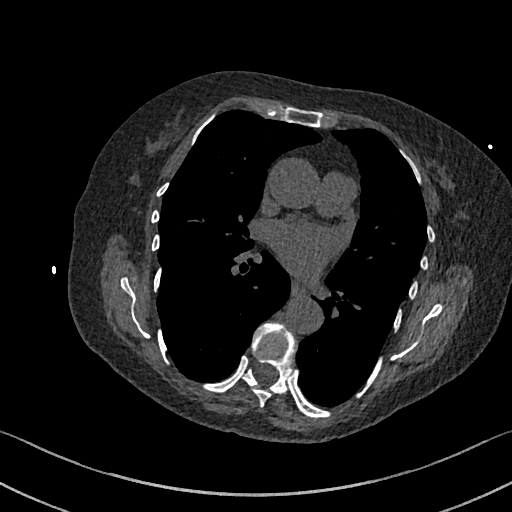

[Series 10: best diast · axial · 0.34mm/px · z∈[+1008,+1081]mm · 3 of 366 slices shown, 4 images]
[im 92/366  vessel]
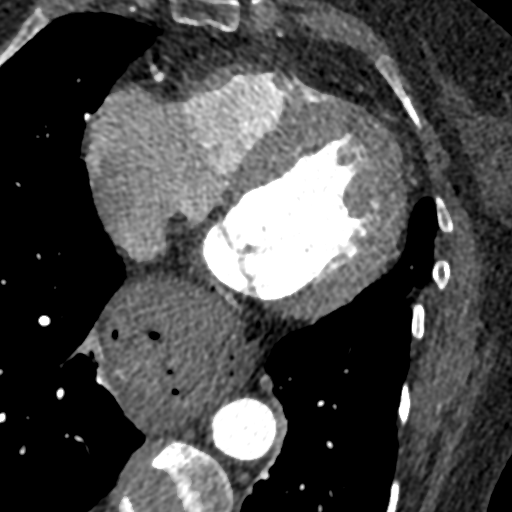
[im 92/366  lung]
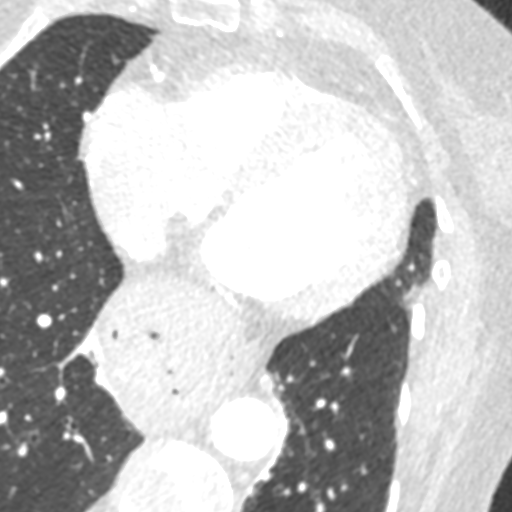
[im 183/366  vessel]
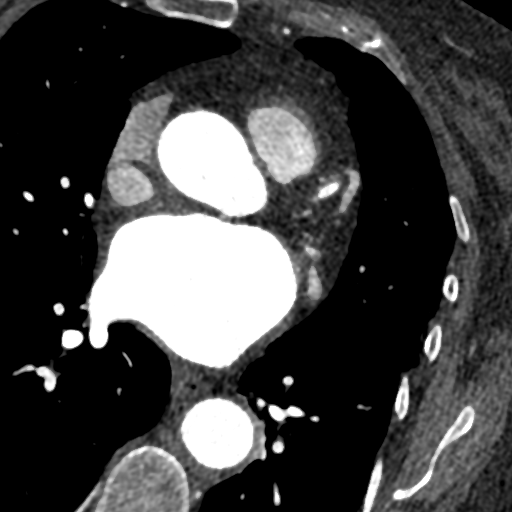
[im 274/366  vessel]
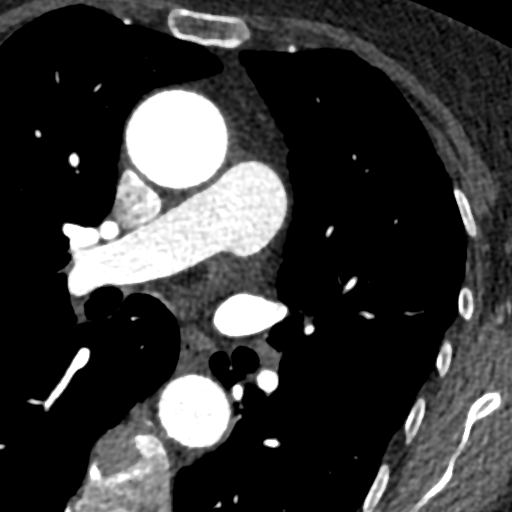

[Series 11: +300 ms · axial · 0.34mm/px · z∈[+1008,+1081]mm · 3 of 366 slices shown]
[im 92/366  vessel]
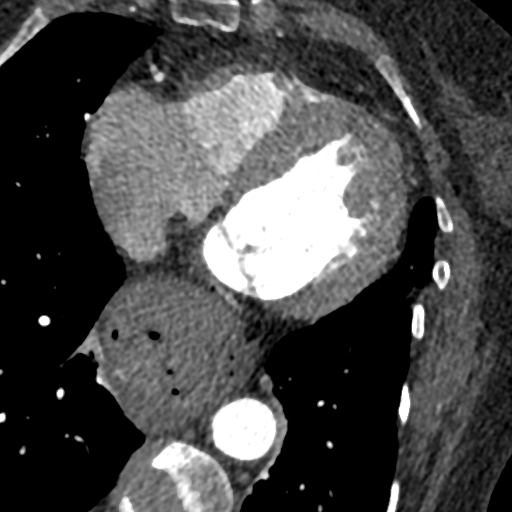
[im 183/366  vessel]
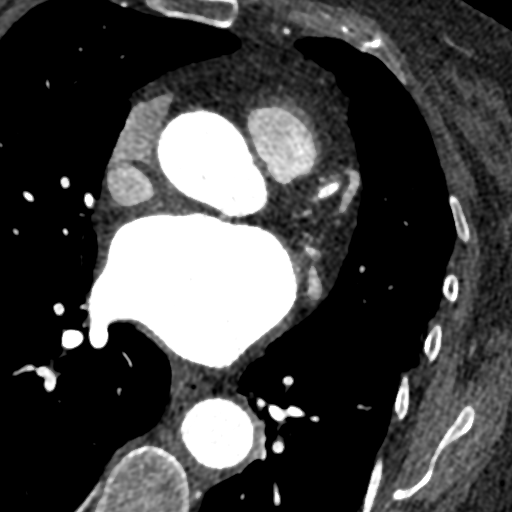
[im 274/366  vessel]
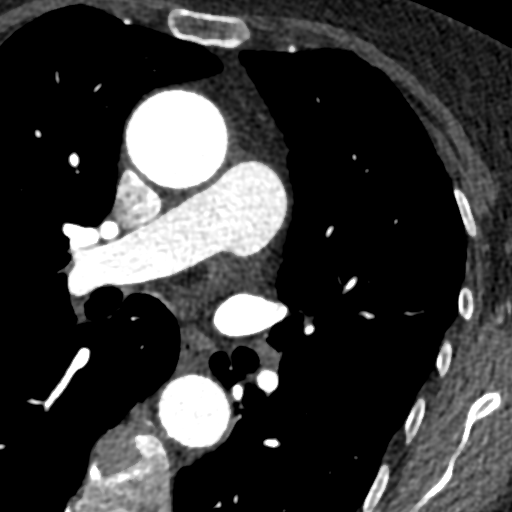

[8 of 20 positions shown; findings below may reference images not displayed]

FINDINGS: Cardiovascular:   Great vessels are normal in course and caliber.

Mediastinum/Nodes: Unremarkable esophagus. No pathologically
enlarged axillary, mediastinal or hilar lymph nodes, noting limited
sensitivity for the detection of hilar adenopathy on this
noncontrast study.

Lungs/Pleura: No pneumothorax. No pleural effusion. No acute
consolidative airspace disease, lung masses or significant pulmonary
nodules.

Upper abdomen: Moderate to large hiatal hernia.

Musculoskeletal: No aggressive appearing focal osseous lesions. Mild
thoracic spondylosis.
IMPRESSION: Moderate to large hiatal hernia. No additional significant
extracardiac findings.
FINDINGS: Severe LAE, moderate FALLON. No ASD/PFO no pericardial effusion Normal
aortic root 3.5 cm Esophagus courses closest to LLPV orifice

No QUIRIJN thrombus

Calcium score 0

LUPV:  Ostium 15.8 mm   area 1.5 cm2

LLPV:   Ostium 14.8 mm  area 1.8 cm2

RUPV:  Ostium 17.1 mm  area 2.7 cm2

RLPV:  Ostium 14.5 mm  area 2.0 cm
IMPRESSION: 1.  No QUIRIJN thrombus

2.  Normal PV anatomy see dimensions above

3.  Calcium Score 0

4.  Normal aortic root 3.5 cm

5.  No pericardial effusion

2.

3.

4.

5.

## 2018-11-10 ENCOUNTER — Ambulatory Visit (INDEPENDENT_AMBULATORY_CARE_PROVIDER_SITE_OTHER): Payer: Medicare Other

## 2018-11-10 ENCOUNTER — Encounter: Payer: Self-pay | Admitting: Podiatry

## 2018-11-10 ENCOUNTER — Other Ambulatory Visit: Payer: Self-pay

## 2018-11-10 ENCOUNTER — Ambulatory Visit (INDEPENDENT_AMBULATORY_CARE_PROVIDER_SITE_OTHER): Payer: Medicare Other | Admitting: Podiatry

## 2018-11-10 VITALS — BP 127/78 | HR 66 | Temp 97.9°F | Resp 16

## 2018-11-10 DIAGNOSIS — M2012 Hallux valgus (acquired), left foot: Secondary | ICD-10-CM

## 2018-11-10 DIAGNOSIS — M2042 Other hammer toe(s) (acquired), left foot: Secondary | ICD-10-CM

## 2018-11-10 NOTE — Patient Instructions (Signed)
Pre-Operative Instructions  Congratulations, you have decided to take an important step towards improving your quality of life.  You can be assured that the doctors and staff at [facility] will be with you every step of the way.  Here are some important things you should know:  1. Plan to be at the surgery center/hospital at least 1 (one) hour prior to your scheduled time, unless otherwise directed by the surgical center/hospital staff.  You must have a responsible adult accompany you, remain during the surgery and drive you home.  Make sure you have directions to the surgical center/hospital to ensure you arrive on time. 2. If you are having surgery at [facility] or [facility], you will need a copy of your medical history and physical form from your family physician within one month prior to the date of surgery. We will give you a form for your primary physician to complete.  3. We make every effort to accommodate the date you request for surgery.  However, there are times where surgery dates or times have to be moved.  We will contact you as soon as possible if a change in schedule is required.   4. No aspirin/ibuprofen for one week before surgery.  If you are on aspirin, any non-steroidal anti-inflammatory medications (Mobic, Aleve, Ibuprofen) should not be taken seven (7) days prior to your surgery.  You make take Tylenol for pain prior to surgery.  5. Medications - If you are taking daily heart and blood pressure medications, seizure, reflux, allergy, asthma, anxiety, pain or diabetes medications, make sure you notify the surgery center/hospital before the day of surgery so they can tell you which medications you should take or avoid the day of surgery. 6. No food or drink after midnight the night before surgery unless directed otherwise by surgical center/hospital staff. 7. No alcoholic beverages 24-hours prior to surgery.  No smoking 24-hours prior or 24-hours after  surgery. 8. Wear loose pants or shorts. They should be loose enough to fit over bandages, boots, and casts. 9. Don't wear slip-on shoes. Sneakers are preferred. 10. Bring your boot with you to the surgery center/hospital.  Also bring crutches or a walker if your physician has prescribed it for you.  If you do not have this equipment, it will be provided for you after surgery. 11. If you have not been contacted by the surgery center/hospital by the day before your surgery, call to confirm the date and time of your surgery. 12. Leave-time from work may vary depending on the type of surgery you have.  Appropriate arrangements should be made prior to surgery with your employer. 13. Prescriptions will be provided immediately following surgery by your doctor.  Fill these as soon as possible after surgery and take the medication as directed. Pain medications will not be refilled on weekends and must be approved by the doctor. 14. Remove nail polish on the operative foot and avoid getting pedicures prior to surgery. 15. Wash the night before surgery.  The night before surgery wash the foot and leg well with water and the antibacterial soap provided. Be sure to pay special attention to beneath the toenails and in between the toes.  Wash for at least three (3) minutes. Rinse thoroughly with water and dry well with a towel.  Perform this wash unless told not to do so by your physician.  Enclosed: 1 Ice pack (please put in freezer the night before surgery)   1 Hibiclens skin cleaner     Pre-op instructions  If you have any questions regarding the instructions, please do not hesitate to call our office.  [place]: [address], [place], [place] [address] -- [phone]  [place]: [address] [place], [place] [address] -- [phone]  [place]: [address]  [place], [place] [address] -- [phone]  [place]: [address], [address], [place], [place] [address] -- [phone]  Website:  [email or web address] 

## 2018-11-11 ENCOUNTER — Encounter: Payer: Self-pay | Admitting: Podiatry

## 2018-11-11 NOTE — Progress Notes (Signed)
Subjective:  Patient ID: Desiree Everett, female    DOB: 1945/06/11,  MRN: [id number] HPI Chief Complaint  Patient presents with  . Toe Pain    2nd toe left - hammertoe deformity x years, over the last year has become painful, overlaps big toe, tenderness plantar forefoot, also concerned with bunion, uses device to keep toe from overlapping  . New Patient (Initial Visit)    [age] y.o. female presents with the above complaint.   ROS: Denies fever chills nausea vomiting muscle aches pains calf pain back pain chest pain shortness of breath.  Past Medical History:  Diagnosis Date  . Arthritis    "I'm sure I have some in my back" (08/12/2017)  . Childhood asthma   . Chronic lower back pain   . GERD (gastroesophageal reflux disease)   . History of hiatal hernia   . History of kidney stones X 1  . Hyperlipidemia   . Hypothyroidism   . Osteoporosis   . Paroxysmal atrial fibrillation (HCC)   . Scoliosis   . Seizure disorder [facility](HCC)    no seizures since age [age]s, on keppra chronically (08/12/2017)   Past Surgical History:  Procedure Laterality Date  . ATRIAL FIBRILLATION ABLATION N/A 08/12/2017   Procedure: ATRIAL FIBRILLATION ABLATION;  Surgeon: Hillis RangeAllred, James, MD;  [facility]: [facility];  Service: Cardiovascular;  Laterality: N/A;  . CARDIAC CATHETERIZATION  01/2017  . TONSILLECTOMY    . VAGINAL HYSTERECTOMY      Current Outpatient Medications:  .  acetaminophen (TYLENOL) 650 MG CR tablet, Take 1,300 mg by mouth daily as needed for pain., Disp: , Rfl:  .  atorvastatin (LIPITOR) 10 MG tablet, Take 10 mg by mouth at bedtime., Disp: , Rfl: 11 .  cholecalciferol (VITAMIN D) 1000 units tablet, Take 1,000 Units by mouth daily., Disp: , Rfl:  .  denosumab (PROLIA) 60 MG/ML SOLN injection, Inject 60 mg into the skin every 6 (six) months. Administer in upper arm, thigh, or abdomen, Disp: , Rfl:  .  esomeprazole (NEXIUM) 40 MG capsule, Take 40 mg by mouth daily., Disp: , Rfl: 5 .   levETIRAcetam (KEPPRA) 500 MG tablet, Take 500 mg by mouth 2 (two) times daily., Disp: , Rfl:  .  levothyroxine (SYNTHROID, LEVOTHROID) 50 MCG tablet, Take 50 mcg by mouth at bedtime. , Disp: , Rfl: 11 .  Liniments (SALONPAS PAIN RELIEF PATCH EX), Apply 1 patch topically daily as needed (pain)., Disp: , Rfl:  .  Multiple Vitamin (MULTIVITAMIN) capsule, Take 1 capsule by mouth daily., Disp: , Rfl:   No Known Allergies Review of Systems Objective:   Vitals:   11/10/18 1044  BP: 127/78  Pulse: 66  Resp: 16  Temp: 97.9 F (36.6 C)    General: Well developed, nourished, in no acute distress, alert and oriented x3   Dermatological: Skin is warm, dry and supple bilateral. Nails x 10 are well maintained; remaining integument appears unremarkable at this time. There are no open sores, no preulcerative lesions, no rash or signs of infection present.  Vascular: Dorsalis Pedis artery and Posterior Tibial artery pedal pulses are 2/4 bilateral with immedate capillary fill time. Pedal hair growth present. No varicosities and no lower extremity edema present bilateral.   Neruologic: Grossly intact via light touch bilateral. Vibratory intact via tuning fork bilateral. Protective threshold with Semmes Wienstein monofilament intact to all pedal sites bilateral. Patellar and Achilles deep tendon reflexes 2+ bilateral. No Babinski or clonus noted bilateral.   Musculoskeletal: No gross  boney pedal deformities bilateral. No pain, crepitus, or limitation noted with foot and ankle range of motion bilateral. Muscular strength 5/5 in all groups tested bilateral.  Hallux abductovalgus deformity with good range of motion of the first metatarsal phalangeal joint relatively reducible.  A reducible hammertoe deformity second left with pain on palpation of the second metatarsal phalangeal joint.  Hammertoe deformity is rigidly deformed at the level of the PIPJ metatarsal phalangeal joint is reducible.  Third similarly.   Gait: Unassisted, Nonantalgic.    Radiographs:  Radiographs taken today demonstrate an osseously mature individual with hallux abductovalgus deformity and increase in the first intermetatarsal angle greater than normal value with a hallux abductus angle greater than normal value.  Hypertrophic medial condyle is noted to the first metatarsal head.  She has and elongated second and third metatarsal of the left foot with severe dislocation of the second toe medially overlapping the hallux.  Mild varus deformity of the third toe with lateral deviation of the third toe.  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  Plan: Discussed etiology pathology conservative versus surgical therapies.  At this point we can see that surgery is indicated.  Hallux valgus repair will be necessary with an Liane Comber type bunionectomy second and third metatarsal osteotomies with hammertoe repair #2 #3 with pins most likely.  I answered all the questions regarding these procedures to the best of my ability in layman's terms.  She understood it was amenable to it.  We did discuss the possible postop complications which may include but are not limited to postop pain bleeding swelling infection recurrence need for further surgery overcorrection under correction loss of digit loss of limb loss of life.  We dispensed information today regarding the surgery center instructions for the morning of preop and information regarding the anesthesia group.  We also dispensed a cam walker and will follow-up with her in the near future for surgical intervention.      T. [place], [place]

## 2018-11-20 ENCOUNTER — Encounter: Payer: Self-pay | Admitting: *Deleted

## 2018-11-20 NOTE — Progress Notes (Signed)
Per Dr. Milinda Pointer, I sent a surgical medical clearance request letter to Dr. Rayann Heman.  The scheduling of her surgery will be based upon Dr. Jackalyn Lombard response.

## 2018-11-24 ENCOUNTER — Telehealth: Payer: Self-pay | Admitting: *Deleted

## 2018-11-24 NOTE — Telephone Encounter (Signed)
   Primary Cardiologist: Thompson Grayer, MD  Chart reviewed as part of pre-operative protocol coverage. Patient was contacted 11/24/2018 in reference to pre-operative risk assessment for pending surgery as outlined below.  Clarene Critchley Ewing Goynes was last seen on 06/18/18 by Dr. Rayann Heman.  Since that day, Desiree Everett has done well. She does not have a history of CAD. She can complete more than 4.0 METS despite being limited by back and foot pain. She is no longer taking eliquis.  Therefore, based on ACC/AHA guidelines, the patient would be at acceptable risk for the planned procedure without further cardiovascular testing.   I will route this recommendation to the requesting party via Epic fax function and remove from pre-op pool.  Please call with questions.  Tami Lin Even Budlong, PA 11/24/2018, 3:52 PM

## 2018-11-24 NOTE — Telephone Encounter (Signed)
Left VM on both numbers. Need to clarify AC.

## 2018-11-24 NOTE — Telephone Encounter (Signed)
° ° °  Please return call to patient on home phone °

## 2018-11-24 NOTE — Telephone Encounter (Signed)
   [facility] Pre-operative Risk Assessment    Request for surgical clearance:  1. What type of surgery is being performed? AUSTIN BUNIONECTOMY, METATARSAL OSTEOTOMY 2, 3 and HAMMER TOE REPAIRS 2, 3 OF THE LEFT FOOT   2. When is this surgery scheduled? TBD   3. What type of clearance is required (medical clearance vs. Pharmacy clearance to hold med vs. Both)? MEDICAL  4. Are there any medications that need to be held prior to surgery and how long? NONE LISTED    5. Practice name and name of physician performing surgery? [facility]; DR. Roselind Messier, DPM   6. What is your office phone number [phone]    7.   What is your office fax number [phone]  8.   Anesthesia type (None, local, MAC, general) ? NOT LISTED; GENERAL?   Julaine Hua 11/24/2018, 12:01 PM  _________________________________________________________________   (provider comments below)

## 2018-12-18 ENCOUNTER — Telehealth: Payer: Self-pay

## 2018-12-18 NOTE — Telephone Encounter (Signed)
Left a message regarding appt on 12/21/18. 

## 2018-12-18 NOTE — Telephone Encounter (Signed)
Spoke with pt regarding appt on 12/21/18. Pt stated she will set up her MyChart account and will call me if she has any questions.

## 2018-12-18 NOTE — Telephone Encounter (Signed)
Follow Up::     Pt said she received your message about her appt. She says she does not have My-Chart. She said she can come in or a Phone Visit will be good. Please call her for her appt on [phone].

## 2018-12-21 ENCOUNTER — Encounter: Payer: Self-pay | Admitting: Internal Medicine

## 2018-12-21 ENCOUNTER — Telehealth (INDEPENDENT_AMBULATORY_CARE_PROVIDER_SITE_OTHER): Payer: Medicare Other | Admitting: Internal Medicine

## 2018-12-21 VITALS — BP 124/82 | HR 89 | Ht 63.0 in | Wt 137.0 lb

## 2018-12-21 DIAGNOSIS — I480 Paroxysmal atrial fibrillation: Secondary | ICD-10-CM | POA: Diagnosis not present

## 2018-12-21 NOTE — Progress Notes (Signed)
Electrophysiology TeleHealth Note   Due to national recommendations of social distancing due to COVID 19, an audio/video telehealth visit is felt to be most appropriate for this patient at this time.  See MyChart message from today for the patient's consent to telehealth for [facility].   Date:  12/21/2018   ID:  Desiree Everett, Desiree Everett 1945/11/23, MRN [id number]  Location: patient's home  Provider location:  [facility]  Evaluation Performed: Follow-up visit  PCP:  Romeo Rabon, MD   Electrophysiologist:  Dr Johney Frame  Chief Complaint:  palpitations  History of Present Illness:    Desiree Everett is a 73 y.o. female who presents via telehealth conferencing today.  Since last being seen in our clinic, the patient reports doing very well.  Today, she denies symptoms of palpitations, chest pain, shortness of breath,  lower extremity edema, dizziness, presyncope, or syncope.  Her primary concern is with back and hip pain. The patient is otherwise without complaint today.  The patient denies symptoms of fevers, chills, cough, or new SOB worrisome for COVID 19.  Past Medical History:  Diagnosis Date  . Arthritis    "I'm sure I have some in my back" (08/12/2017)  . Childhood asthma   . Chronic lower back pain   . GERD (gastroesophageal reflux disease)   . History of hiatal hernia   . History of kidney stones X 1  . Hyperlipidemia   . Hypothyroidism   . Osteoporosis   . Paroxysmal atrial fibrillation (HCC)   . Scoliosis   . Seizure disorder [facility])    no seizures since age 61s, on keppra chronically (08/12/2017)    Past Surgical History:  Procedure Laterality Date  . ATRIAL FIBRILLATION ABLATION N/A 08/12/2017   Procedure: ATRIAL FIBRILLATION ABLATION;  Surgeon: Hillis Range, MD;  Location: [facility];  Service: Cardiovascular;  Laterality: N/A;  . CARDIAC CATHETERIZATION  01/2017  . TONSILLECTOMY    . VAGINAL HYSTERECTOMY      Current Outpatient  Medications  Medication Sig Dispense Refill  . acetaminophen (TYLENOL) 500 MG tablet Take 500 mg by mouth every 6 (six) hours as needed for mild pain.    Marland Kitchen atorvastatin (LIPITOR) 40 MG tablet Take 40 mg by mouth daily.    Marland Kitchen denosumab (PROLIA) 60 MG/ML SOLN injection Inject 60 mg into the skin every 6 (six) months. Administer in upper arm, thigh, or abdomen    . esomeprazole (NEXIUM) 40 MG capsule Take 40 mg by mouth daily.  5  . levETIRAcetam (KEPPRA) 500 MG tablet Take 500 mg by mouth 2 (two) times daily.    Marland Kitchen levothyroxine (SYNTHROID, LEVOTHROID) 50 MCG tablet Take 50 mcg by mouth at bedtime.   11  . Liniments (SALONPAS PAIN RELIEF PATCH EX) Apply 1 patch topically daily as needed (pain).    . Multiple Vitamin (MULTIVITAMIN) capsule Take 1 capsule by mouth daily.    Marland Kitchen VITAMIN D, CHOLECALCIFEROL, PO Take 5,000 Units by mouth daily.     No current facility-administered medications for this visit.     Allergies:   Patient has no known allergies.   Social History:  The patient  reports that she has never smoked. She has never used smokeless tobacco. She reports that she does not drink alcohol or use drugs.   Family History:  The patient's  family history includes Arrhythmia in her mother; Heart attack in her father.   ROS:  Please see the history of present illness.   All  other systems are personally reviewed and negative.    Exam:    Vital Signs:  BP 124/82   Pulse 89   Ht 5\' 3"  (1.6 m)   Wt 137 lb (62.1 kg)   BMI 24.27 kg/m   Well sounding and appearing, alert and conversant, regular work of breathing,  good skin color Eyes- anicteric, neuro- grossly intact, skin- no apparent rash or lesions or cyanosis, mouth- oral mucosa is pink  Labs/Other Tests and Data Reviewed:    Recent Labs: No results found for requested labs within last 8760 hours.   Wt Readings from Last 3 Encounters:  12/21/18 137 lb (62.1 kg)  02/18/18 140 lb 6.4 oz (63.7 kg)  11/17/17 141 lb 12.8 oz (64.3 kg)        ASSESSMENT & PLAN:    1.  Paroxysmal atrial fibrillation Doing very well post ablation off AAD therapy chads2vasc score is 2.  As per guidelines, she is not on anticoagulation   Follow-up:  6 months with AF clinic I will see in a year  Patient Risk:  after full review of this patients clinical status, I feel that they are at moderate risk at this time.  Today, I have spent 15 minutes with the patient with telehealth technology discussing arrhythmia management .    Army Fossa, MD  12/21/2018 10:02 AM     [facility] [address] [place] [address] [phone] (office) [phone] (fax)

## 2018-12-29 ENCOUNTER — Other Ambulatory Visit: Payer: Self-pay | Admitting: Orthopaedic Surgery

## 2018-12-29 DIAGNOSIS — M419 Scoliosis, unspecified: Secondary | ICD-10-CM

## 2019-01-09 ENCOUNTER — Other Ambulatory Visit: Payer: Self-pay

## 2019-01-09 ENCOUNTER — Ambulatory Visit
Admission: RE | Admit: 2019-01-09 | Discharge: 2019-01-09 | Disposition: A | Discharge status: Home / Self Care | Payer: Medicare Other | Source: Ambulatory Visit | Attending: Orthopaedic Surgery | Admitting: Orthopaedic Surgery

## 2019-01-09 DIAGNOSIS — M419 Scoliosis, unspecified: Secondary | ICD-10-CM

## 2019-01-12 ENCOUNTER — Other Ambulatory Visit: Payer: Self-pay

## 2019-08-12 ENCOUNTER — Telehealth (HOSPITAL_COMMUNITY): Payer: Self-pay | Admitting: *Deleted

## 2019-08-12 NOTE — Telephone Encounter (Signed)
Pt did not feel like AF appt in 6 months needed - doing well no issues - has follow up with Dr. Johney Frame in September - will call if issues arise prior to this appt. Recall removed

## 2019-12-13 ENCOUNTER — Other Ambulatory Visit: Payer: Self-pay

## 2019-12-13 ENCOUNTER — Ambulatory Visit (INDEPENDENT_AMBULATORY_CARE_PROVIDER_SITE_OTHER): Payer: Medicare Other | Admitting: Internal Medicine

## 2019-12-13 ENCOUNTER — Encounter: Payer: Self-pay | Admitting: Internal Medicine

## 2019-12-13 VITALS — BP 126/84 | HR 86 | Ht 63.0 in | Wt 135.0 lb

## 2019-12-13 DIAGNOSIS — I480 Paroxysmal atrial fibrillation: Secondary | ICD-10-CM

## 2019-12-13 DIAGNOSIS — E7800 Pure hypercholesterolemia, unspecified: Secondary | ICD-10-CM | POA: Diagnosis not present

## 2019-12-13 NOTE — Progress Notes (Signed)
PCP: Romeo Rabon, MD   Primary EP: Dr Neale Burly Ewing Sleep is a 74 y.o. female who presents today for routine electrophysiology followup.  Since last being seen in our clinic, the patient reports doing very well.  Today, she denies symptoms of palpitations, chest pain, shortness of breath,  lower extremity edema, dizziness, presyncope, or syncope.  The patient is otherwise without complaint today.   Past Medical History:  Diagnosis Date  . Arthritis    "I'm sure I have some in my back" (08/12/2017)  . Childhood asthma   . Chronic lower back pain   . GERD (gastroesophageal reflux disease)   . History of hiatal hernia   . History of kidney stones X 1  . Hyperlipidemia   . Hypothyroidism   . Osteoporosis   . Paroxysmal atrial fibrillation (HCC)   . Scoliosis   . Seizure disorder [facility])    no seizures since age 71s, on keppra chronically (08/12/2017)   Past Surgical History:  Procedure Laterality Date  . ATRIAL FIBRILLATION ABLATION N/A 08/12/2017   Procedure: ATRIAL FIBRILLATION ABLATION;  Surgeon: Hillis Range, MD;  Location: [facility];  Service: Cardiovascular;  Laterality: N/A;  . CARDIAC CATHETERIZATION  01/2017  . TONSILLECTOMY    . VAGINAL HYSTERECTOMY      ROS- all systems are reviewed and negatives except as per HPI above  Current Outpatient Medications  Medication Sig Dispense Refill  . acetaminophen (TYLENOL) 500 MG tablet Take 500 mg by mouth every 6 (six) hours as needed for mild pain.    Marland Kitchen atorvastatin (LIPITOR) 10 MG tablet Take 10 mg by mouth daily.    Marland Kitchen CALCIUM PO Take 1 tablet by mouth daily.    Marland Kitchen denosumab (PROLIA) 60 MG/ML SOLN injection Inject 60 mg into the skin every 6 (six) months. Administer in upper arm, thigh, or abdomen    . esomeprazole (NEXIUM) 40 MG capsule Take 40 mg by mouth daily.  5  . levETIRAcetam (KEPPRA) 500 MG tablet Take 500 mg by mouth 2 (two) times daily.    Marland Kitchen levothyroxine (SYNTHROID, LEVOTHROID) 50 MCG tablet Take  50 mcg by mouth at bedtime.   11  . Liniments (SALONPAS PAIN RELIEF PATCH EX) Apply 1 patch topically daily as needed (pain).    . meloxicam (MOBIC) 15 MG tablet Take 15 mg by mouth daily.    . Multiple Vitamin (MULTIVITAMIN) capsule Take 1 capsule by mouth daily.    . vitamin B-12 (CYANOCOBALAMIN) 1000 MCG tablet Take 1,000 mcg by mouth daily.    Marland Kitchen VITAMIN D, CHOLECALCIFEROL, PO Take 5,000 Units by mouth daily.     No current facility-administered medications for this visit.    Physical Exam: Vitals:   12/13/19 0944  BP: 126/84  Pulse: 86  SpO2: 95%  Weight: 135 lb (61.2 kg)  Height: 5\' 3"  (1.6 m)    GEN- The patient is well appearing, alert and oriented x 3 today.   Head- normocephalic, atraumatic Eyes-  Sclera clear, conjunctiva pink Ears- hearing intact Oropharynx- clear Lungs- Clear to ausculation bilaterally, normal work of breathing Heart- Regular rate and rhythm, no murmurs, rubs or gallops, PMI not laterally displaced GI- soft, NT, ND, + BS Extremities- no clubbing, cyanosis, or edema  Wt Readings from Last 3 Encounters:  12/13/19 135 lb (61.2 kg)  12/21/18 137 lb (62.1 kg)  02/18/18 140 lb 6.4 oz (63.7 kg)    EKG tracing ordered today is personally reviewed and shows sinus  Assessment  and Plan:  1. Paroxysmal atrial fibrillation Well controllest post ablation off AAD therapy chads2vasc score is 2.  As per guidelines, she does not require OAC therapy at this time.  Of note, she will be 74 in November. We discussed options of watchful waiting, initiation of OAC, and long term monitoring. I also discussed REACT AF trial and she would be interested in this approach.  2. HL Continue statin  Risks, benefits and potential toxicities for medications prescribed and/or refilled reviewed with patient today.   Return in a year  Hillis Range MD, [facility] 12/13/2019 10:18 AM

## 2019-12-13 NOTE — Patient Instructions (Addendum)
Medication Instructions:  Your physician recommends that you continue on your current medications as directed. Please refer to the Current Medication list given to you today.  *If you need a refill on your cardiac medications before your next appointment, please call your pharmacy*  Lab Work: None ordered.  If you have labs (blood work) drawn today and your tests are completely normal, you will receive your results only by: Marland Kitchen MyChart Message (if you have MyChart) OR . A paper copy in the mail If you have any lab test that is abnormal or we need to change your treatment, we will call you to review the results.  Testing/Procedures: None ordered.  Follow-Up: At [facility], you and your health needs are our priority.  As part of our continuing mission to provide you with exceptional heart care, we have created designated Provider Care Teams.  These Care Teams include your primary Cardiologist (physician) and Advanced Practice Providers (APPs -  Physician Assistants and Nurse Practitioners) who all work together to provide you with the care you need, when you need it.  We recommend signing up for the patient portal called "MyChart".  Sign up information is provided on this After Visit Summary.  MyChart is used to connect with patients for Virtual Visits (Telemedicine).  Patients are able to view lab/test results, encounter notes, upcoming appointments, etc.  Non-urgent messages can be sent to your provider as well.   To learn more about what you can do with MyChart, go to [email or web address].    Your next appointment:   Your physician wants you to follow-up in: 12/19/2019 at 11 am with Dr. Johney Frame.   Other Instructions:

## 2020-01-09 ENCOUNTER — Other Ambulatory Visit (HOSPITAL_COMMUNITY): Payer: Self-pay | Admitting: Physician Assistant

## 2020-01-09 ENCOUNTER — Ambulatory Visit (HOSPITAL_COMMUNITY)
Admission: RE | Admit: 2020-01-09 | Discharge: 2020-01-09 | Disposition: A | Discharge status: Home / Self Care | Payer: Medicare Other | Source: Ambulatory Visit | Attending: Pulmonary Disease | Admitting: Pulmonary Disease

## 2020-01-09 DIAGNOSIS — Z23 Encounter for immunization: Secondary | ICD-10-CM | POA: Diagnosis not present

## 2020-01-09 DIAGNOSIS — U071 COVID-19: Secondary | ICD-10-CM | POA: Insufficient documentation

## 2020-01-09 DIAGNOSIS — R54 Age-related physical debility: Secondary | ICD-10-CM | POA: Insufficient documentation

## 2020-01-09 MED ORDER — SODIUM CHLORIDE 0.9 % IV SOLN: Freq: Once | INTRAVENOUS | Status: AC

## 2020-01-09 MED ORDER — SODIUM CHLORIDE 0.9 % IV SOLN: INTRAVENOUS | Status: DC | PRN

## 2020-01-09 MED ORDER — METHYLPREDNISOLONE SODIUM SUCC 125 MG IJ SOLR: 125.0000 mg | Freq: Once | INTRAMUSCULAR | Status: DC | PRN

## 2020-01-09 MED ORDER — EPINEPHRINE 0.3 MG/0.3ML IJ SOAJ: 0.3000 mg | Freq: Once | INTRAMUSCULAR | Status: DC | PRN

## 2020-01-09 MED ORDER — FAMOTIDINE IN NACL 20-0.9 MG/50ML-% IV SOLN: 20.0000 mg | Freq: Once | INTRAVENOUS | Status: DC | PRN

## 2020-01-09 MED ORDER — ALBUTEROL SULFATE HFA 108 (90 BASE) MCG/ACT IN AERS
2.0000 | INHALATION_SPRAY | Freq: Once | RESPIRATORY_TRACT | Status: DC | PRN
Start: 2020-01-09 — End: 2020-01-10

## 2020-01-09 MED ORDER — DIPHENHYDRAMINE HCL 50 MG/ML IJ SOLN: 50.0000 mg | Freq: Once | INTRAMUSCULAR | Status: DC | PRN

## 2020-01-09 NOTE — Discharge Instructions (Signed)
COVID-19 COVID-19 is a respiratory infection that is caused by a virus called severe acute respiratory syndrome coronavirus 2 (SARS-CoV-2). The disease is also known as coronavirus disease or novel coronavirus. In some people, the virus may not cause any symptoms. In others, it may cause a serious infection. The infection can get worse quickly and can lead to complications, such as:  Pneumonia, or infection of the lungs.  Acute respiratory distress syndrome or ARDS. This is a condition in which fluid build-up in the lungs prevents the lungs from filling with air and passing oxygen into the blood.  Acute respiratory failure. This is a condition in which there is not enough oxygen passing from the lungs to the body or when carbon dioxide is not passing from the lungs out of the body.  Sepsis or septic shock. This is a serious bodily reaction to an infection.  Blood clotting problems.  Secondary infections due to bacteria or fungus.  Organ failure. This is when your body's organs stop working. The virus that causes COVID-19 is contagious. This means that it can spread from person to person through droplets from coughs and sneezes (respiratory secretions). What are the causes? This illness is caused by a virus. You may catch the virus by:  Breathing in droplets from an infected person. Droplets can be spread by a person breathing, speaking, singing, coughing, or sneezing.  Touching something, like a table or a doorknob, that was exposed to the virus (contaminated) and then touching your mouth, nose, or eyes. What increases the risk? Risk for infection You are more likely to be infected with this virus if you:  Are within 6 feet (2 meters) of a person with COVID-19.  Provide care for or live with a person who is infected with COVID-19.  Spend time in crowded indoor spaces or live in shared housing. Risk for serious illness You are more likely to become seriously ill from the virus if  you:  Are 50 years of age or older. The higher your age, the more you are at risk for serious illness.  Live in a nursing home or long-term care facility.  Have cancer.  Have a long-term (chronic) disease such as: ? Chronic lung disease, including chronic obstructive pulmonary disease or asthma. ? A long-term disease that lowers your body's ability to fight infection (immunocompromised). ? Heart disease, including heart failure, a condition in which the arteries that lead to the heart become narrow or blocked (coronary artery disease), a disease which makes the heart muscle thick, weak, or stiff (cardiomyopathy). ? Diabetes. ? Chronic kidney disease. ? Sickle cell disease, a condition in which red blood cells have an abnormal "sickle" shape. ? Liver disease.  Are obese. What are the signs or symptoms? Symptoms of this condition can range from mild to severe. Symptoms may appear any time from 2 to 14 days after being exposed to the virus. They include:  A fever or chills.  A cough.  Difficulty breathing.  Headaches, body aches, or muscle aches.  Runny or stuffy (congested) nose.  A sore throat.  New loss of taste or smell. Some people may also have stomach problems, such as nausea, vomiting, or diarrhea. Other people may not have any symptoms of COVID-19. How is this diagnosed? This condition may be diagnosed based on:  Your signs and symptoms, especially if: ? You live in an area with a COVID-19 outbreak. ? You recently traveled to or from an area where the virus is common. ? You   provide care for or live with a person who was diagnosed with COVID-19. ? You were exposed to a person who was diagnosed with COVID-19.  A physical exam.  Lab tests, which may include: ? Taking a sample of fluid from the back of your nose and throat (nasopharyngeal fluid), your nose, or your throat using a swab. ? A sample of mucus from your lungs (sputum). ? Blood tests.  Imaging tests,  which may include, X-rays, CT scan, or ultrasound. How is this treated? At present, there is no medicine to treat COVID-19. Medicines that treat other diseases are being used on a trial basis to see if they are effective against COVID-19. Your health care provider will talk with you about ways to treat your symptoms. For most people, the infection is mild and can be managed at home with rest, fluids, and over-the-counter medicines. Treatment for a serious infection usually takes places in a hospital intensive care unit (ICU). It may include one or more of the following treatments. These treatments are given until your symptoms improve.  Receiving fluids and medicines through an IV.  Supplemental oxygen. Extra oxygen is given through a tube in the nose, a face mask, or a hood.  Positioning you to lie on your stomach (prone position). This makes it easier for oxygen to get into the lungs.  Continuous positive airway pressure (CPAP) or bi-level positive airway pressure (BPAP) machine. This treatment uses mild air pressure to keep the airways open. A tube that is connected to a motor delivers oxygen to the body.  Ventilator. This treatment moves air into and out of the lungs by using a tube that is placed in your windpipe.  Tracheostomy. This is a procedure to create a hole in the neck so that a breathing tube can be inserted.  Extracorporeal membrane oxygenation (ECMO). This procedure gives the lungs a chance to recover by taking over the functions of the heart and lungs. It supplies oxygen to the body and removes carbon dioxide. Follow these instructions at home: Lifestyle  If you are sick, stay home except to get medical care. Your health care provider will tell you how long to stay home. Call your health care provider before you go for medical care.  Rest at home as told by your health care provider.  Do not use any products that contain nicotine or tobacco, such as cigarettes,  e-cigarettes, and chewing tobacco. If you need help quitting, ask your health care provider.  Return to your normal activities as told by your health care provider. Ask your health care provider what activities are safe for you. General instructions  Take over-the-counter and prescription medicines only as told by your health care provider.  Drink enough fluid to keep your urine pale yellow.  Keep all follow-up visits as told by your health care provider. This is important. How is this prevented?  There is no vaccine to help prevent COVID-19 infection. However, there are steps you can take to protect yourself and others from this virus. To protect yourself:   Do not travel to areas where COVID-19 is a risk. The areas where COVID-19 is reported change often. To identify high-risk areas and travel restrictions, check the CDC travel website: wwwnc.cdc.gov/travel/notices  If you live in, or must travel to, an area where COVID-19 is a risk, take precautions to avoid infection. ? Stay away from people who are sick. ? Wash your hands often with soap and water for 20 seconds. If soap and water   are not available, use an alcohol-based hand sanitizer. ? Avoid touching your mouth, face, eyes, or nose. ? Avoid going out in public, follow guidance from your state and local health authorities. ? If you must go out in public, wear a cloth face covering or face mask. Make sure your mask covers your nose and mouth. ? Avoid crowded indoor spaces. Stay at least 6 feet (2 meters) away from others. ? Disinfect objects and surfaces that are frequently touched every day. This may include:  Counters and tables.  Doorknobs and light switches.  Sinks and faucets.  Electronics, such as phones, remote controls, keyboards, computers, and tablets. To protect others: If you have symptoms of COVID-19, take steps to prevent the virus from spreading to others.  If you think you have a COVID-19 infection, contact  your health care provider right away. Tell your health care team that you think you may have a COVID-19 infection.  Stay home. Leave your house only to seek medical care. Do not use public transport.  Do not travel while you are sick.  Wash your hands often with soap and water for 20 seconds. If soap and water are not available, use alcohol-based hand sanitizer.  Stay away from other members of your household. Let healthy household members care for children and pets, if possible. If you have to care for children or pets, wash your hands often and wear a mask. If possible, stay in your own room, separate from others. Use a different bathroom.  Make sure that all people in your household wash their hands well and often.  Cough or sneeze into a tissue or your sleeve or elbow. Do not cough or sneeze into your hand or into the air.  Wear a cloth face covering or face mask. Make sure your mask covers your nose and mouth. Where to find more information  Centers for Disease Control and Prevention: www.cdc.gov/coronavirus/2019-ncov/index.html  World Health Organization: www.who.int/health-topics/coronavirus Contact a health care provider if:  You live in or have traveled to an area where COVID-19 is a risk and you have symptoms of the infection.  You have had contact with someone who has COVID-19 and you have symptoms of the infection. Get help right away if:  You have trouble breathing.  You have pain or pressure in your chest.  You have confusion.  You have bluish lips and fingernails.  You have difficulty waking from sleep.  You have symptoms that get worse. These symptoms may represent a serious problem that is an emergency. Do not wait to see if the symptoms will go away. Get medical help right away. Call your local emergency services (911 in the [place] Do not drive yourself to the hospital. Let the emergency medical personnel know if you think you have  COVID-19. Summary  COVID-19 is a respiratory infection that is caused by a virus. It is also known as coronavirus disease or novel coronavirus. It can cause serious infections, such as pneumonia, acute respiratory distress syndrome, acute respiratory failure, or sepsis.  The virus that causes COVID-19 is contagious. This means that it can spread from person to person through droplets from breathing, speaking, singing, coughing, or sneezing.  You are more likely to develop a serious illness if you are 50 years of age or older, have a weak immune system, live in a nursing home, or have chronic disease.  There is no medicine to treat COVID-19. Your health care provider will talk with you about ways to treat your symptoms.    Take steps to protect yourself and others from infection. Wash your hands often and disinfect objects and surfaces that are frequently touched every day. Stay away from people who are sick and wear a mask if you are sick. This information is not intended to replace advice given to you by your health care provider. Make sure you discuss any questions you have with your health care provider. Document Revised: 01/15/2019 Document Reviewed: 04/23/2018 Elsevier Patient Education  2020 Elsevier Inc. What types of side effects do monoclonal antibody drugs cause?  Common side effects  In general, the more common side effects caused by monoclonal antibody drugs include: . Allergic reactions, such as hives or itching . Flu-like signs and symptoms, including chills, fatigue, fever, and muscle aches and pains . Nausea, vomiting . Diarrhea . Skin rashes . Low blood pressure   The CDC is recommending patients who receive monoclonal antibody treatments wait at least 90 days before being vaccinated.  Currently, there are no data on the safety and efficacy of mRNA COVID-19 vaccines in persons who received monoclonal antibodies or convalescent plasma as part of COVID-19 treatment. Based  on the estimated half-life of such therapies as well as evidence suggesting that reinfection is uncommon in the 90 days after initial infection, vaccination should be deferred for at least 90 days, as a precautionary measure until additional information becomes available, to avoid interference of the antibody treatment with vaccine-induced immune responses. 

## 2020-01-09 NOTE — Progress Notes (Signed)
I connected by phone with Desiree Everett on 01/09/2020 at 11:40 AM to discuss the potential use of a new treatment for mild to moderate COVID-19 viral infection in non-hospitalized patients.  This patient is a 74 y.o. female that meets the FDA criteria for Emergency Use Authorization of COVID monoclonal antibody casirivimab/imdevimab or bamlanivimab/eteseviamb.  Has a (+) direct SARS-CoV-2 viral test result  Has mild or moderate COVID-19   Is NOT hospitalized due to COVID-19  Is within 10 days of symptom onset  Has at least one of the high risk factor(s) for progression to severe COVID-19 and/or hospitalization as defined in EUA.  Specific high risk criteria : Older age (>/= 76 yo)   I have spoken and communicated the following to the patient or parent/caregiver regarding COVID monoclonal antibody treatment:  1. FDA has authorized the emergency use for the treatment of mild to moderate COVID-19 in adults and pediatric patients with positive results of direct SARS-CoV-2 viral testing who are 44 years of age and older weighing at least 40 kg, and who are at high risk for progressing to severe COVID-19 and/or hospitalization.  2. The significant known and potential risks and benefits of COVID monoclonal antibody, and the extent to which such potential risks and benefits are unknown.  3. Information on available alternative treatments and the risks and benefits of those alternatives, including clinical trials.  4. Patients treated with COVID monoclonal antibody should continue to self-isolate and use infection control measures (e.g., wear mask, isolate, social distance, avoid sharing personal items, clean and disinfect "high touch" surfaces, and frequent handwashing) according to CDC guidelines.   5. The patient or parent/caregiver has the option to accept or refuse COVID monoclonal antibody treatment.  After reviewing this information with the patient, the patient has agreed to receive  one of the available covid 19 monoclonal antibodies and will be provided an appropriate fact sheet prior to infusion. Jodell Cipro, PA-C 01/09/2020 11:40 AM

## 2020-01-09 NOTE — Progress Notes (Signed)
  Diagnosis: COVID-19  Physician: Dr Wright  Procedure: [facility] Med: bamlanivimab\etesevimab infusion - Provided patient with bamlanimivab\etesevimab fact sheet for patients, parents and caregivers prior to infusion.    Complications: No immediate complications noted.  Discharge: Discharged home   Desiree Everett L 01/09/2020   

## 2020-12-18 ENCOUNTER — Ambulatory Visit (INDEPENDENT_AMBULATORY_CARE_PROVIDER_SITE_OTHER): Payer: Medicare Other | Admitting: Internal Medicine

## 2020-12-18 ENCOUNTER — Other Ambulatory Visit: Payer: Self-pay

## 2020-12-18 VITALS — BP 120/80 | HR 81 | Ht 63.0 in | Wt 134.0 lb

## 2020-12-18 DIAGNOSIS — E7800 Pure hypercholesterolemia, unspecified: Secondary | ICD-10-CM

## 2020-12-18 DIAGNOSIS — I480 Paroxysmal atrial fibrillation: Secondary | ICD-10-CM | POA: Diagnosis not present

## 2020-12-18 NOTE — Patient Instructions (Addendum)
Medication Instructions:  Your physician recommends that you continue on your current medications as directed. Please refer to the Current Medication list given to you today.  Labwork: None ordered.  Testing/Procedures: None ordered.  Follow-Up: Your physician wants you to follow-up in: 12 months with the [facility]. They will contact you to schedule.  [facility] INFORMATION:  The [facility] is located in the [facility] at [facility]. Parking instructions/directions: [address] ([address]). When you pull in to Entrance C, there is an underground parking garage to your right. The code to enter the garage is TBD. Take the elevators to the first floor. Follow the signs to the [facility]. You will see registration at the end of the hallway.  Phone number: [phone]    Any Other Special Instructions Will Be Listed Below (If Applicable).  If you need a refill on your cardiac medications before your next appointment, please call your pharmacy.

## 2020-12-18 NOTE — Progress Notes (Signed)
PCP: Romeo Rabon, MD   Primary EP: Dr Desiree Everett is a 75 y.o. female who presents today for routine electrophysiology followup.  Since last being seen in our clinic, the patient reports doing very well.  Today, she denies symptoms of palpitations, chest pain, shortness of breath,  lower extremity edema, dizziness, presyncope, or syncope.  The patient is otherwise without complaint today.   Past Medical History:  Diagnosis Date   Arthritis    "I'm sure I have some in my back" (08/12/2017)   Childhood asthma    Chronic lower back pain    GERD (gastroesophageal reflux disease)    History of hiatal hernia    History of kidney stones X 1   Hyperlipidemia    Hypothyroidism    Osteoporosis    Paroxysmal atrial fibrillation (HCC)    Scoliosis    Seizure disorder (HCC)    no seizures since age 60s, on keppra chronically (08/12/2017)   Past Surgical History:  Procedure Laterality Date   ATRIAL FIBRILLATION ABLATION N/A 08/12/2017   Procedure: ATRIAL FIBRILLATION ABLATION;  Surgeon: Hillis Range, MD;  Location: [facility];  Service: Cardiovascular;  Laterality: N/A;   CARDIAC CATHETERIZATION  01/2017   TONSILLECTOMY     VAGINAL HYSTERECTOMY      ROS- all systems are reviewed and negatives except as per HPI above  Current Outpatient Medications  Medication Sig Dispense Refill   acetaminophen (TYLENOL) 500 MG tablet Take 500 mg by mouth every 6 (six) hours as needed for mild pain.     atorvastatin (LIPITOR) 10 MG tablet Take 10 mg by mouth daily.     CALCIUM PO Take 1 tablet by mouth daily.     denosumab (PROLIA) 60 MG/ML SOLN injection Inject 60 mg into the skin every 6 (six) months. Administer in upper arm, thigh, or abdomen     esomeprazole (NEXIUM) 40 MG capsule Take 40 mg by mouth daily.  5   levETIRAcetam (KEPPRA) 500 MG tablet Take 500 mg by mouth 2 (two) times daily.     levothyroxine (SYNTHROID, LEVOTHROID) 50 MCG tablet Take 50 mcg by mouth at  bedtime.   11   Liniments (SALONPAS PAIN RELIEF PATCH EX) Apply 1 patch topically daily as needed (pain).     meloxicam (MOBIC) 15 MG tablet Take 15 mg by mouth daily.     Multiple Vitamin (MULTIVITAMIN) capsule Take 1 capsule by mouth daily.     vitamin B-12 (CYANOCOBALAMIN) 1000 MCG tablet Take 1,000 mcg by mouth daily.     VITAMIN D, CHOLECALCIFEROL, PO Take 5,000 Units by mouth daily.     No current facility-administered medications for this visit.    Physical Exam: There were no vitals filed for this visit.  GEN- The patient is well appearing, alert and oriented x 3 today.   Head- normocephalic, atraumatic Eyes-  Sclera clear, conjunctiva pink Ears- hearing intact Oropharynx- clear Lungs- Clear to ausculation bilaterally, normal work of breathing Heart- Regular rate and rhythm, no murmurs, rubs or gallops, PMI not laterally displaced GI- soft, NT, ND, + BS Extremities- no clubbing, cyanosis, or edema  Wt Readings from Last 3 Encounters:  12/13/19 135 lb (61.2 kg)  12/21/18 137 lb (62.1 kg)  02/18/18 140 lb 6.4 oz (63.7 kg)    EKG tracing ordered today is personally reviewed and shows sinus rhythm with PACs  Assessment and Plan:  Paroxysmal atrial fibrillation She has done very well post ablation off AAD therapy Chads2vasc  score is at least 3.  She does not wish to start [facility] currently Could consider long term monitoring vs React AF trial.  She wishes to make no changes today  Risks, benefits and potential toxicities for medications prescribed and/or refilled reviewed with patient today.   Return to AF clinic in a year   Hillis Range MD, [facility] 12/18/2020 11:11 AM

## 2020-12-19 DIAGNOSIS — R252 Cramp and spasm: Secondary | ICD-10-CM | POA: Insufficient documentation

## 2020-12-19 DIAGNOSIS — R202 Paresthesia of skin: Secondary | ICD-10-CM | POA: Insufficient documentation

## 2020-12-22 DIAGNOSIS — R400 Somnolence: Secondary | ICD-10-CM | POA: Insufficient documentation

## 2021-04-24 ENCOUNTER — Ambulatory Visit (INDEPENDENT_AMBULATORY_CARE_PROVIDER_SITE_OTHER): Payer: Medicare Other | Admitting: Podiatry

## 2021-04-24 ENCOUNTER — Encounter: Payer: Self-pay | Admitting: Podiatry

## 2021-04-24 ENCOUNTER — Other Ambulatory Visit: Payer: Self-pay

## 2021-04-24 DIAGNOSIS — M2042 Other hammer toe(s) (acquired), left foot: Secondary | ICD-10-CM

## 2021-04-24 DIAGNOSIS — M2012 Hallux valgus (acquired), left foot: Secondary | ICD-10-CM | POA: Diagnosis not present

## 2021-04-24 DIAGNOSIS — G5793 Unspecified mononeuropathy of bilateral lower limbs: Secondary | ICD-10-CM | POA: Diagnosis not present

## 2021-04-24 MED ORDER — GABAPENTIN 100 MG PO CAPS: 100.0000 mg | ORAL_CAPSULE | Freq: Every day | ORAL | 0 refills | Status: DC

## 2021-04-24 NOTE — Progress Notes (Signed)
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  She states that was hoping is unable to avoid surgery but looks like unguinal have to have it anyway over the any other options.  She also complaining of pain to the bilateral heel tingling and numbness.  She states that the left seems to be worse than the right but she noticed this after a fall back in July when she had her feet under her.  She states that it started then.  Objective: Vital signs are stable alert oriented x3 pulses are palpable neurologic sensorium is intact deep tendon reflexes are intact muscle strength is normal and symmetrical.  She has hallux abductovalgus deformity left foot with a completely cocked up second toe at 90 degrees to hallux is completely dislocated at the metatarsophalangeal joint and would require Lapidus procedure and a hammertoe repair second with a second met osteotomy we discussed this in the past.  Assessment: Neuralgias and neuropathies etiology unknown.  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  Plan: At this point unguinal refer her to neurology for evaluation and treatment I would also like to go ahead and get this done prior to surgical intervention.  I did provide her with gabapentin 100 mg at nighttime to help with her neuropathy.

## 2021-04-25 ENCOUNTER — Telehealth: Payer: Self-pay | Admitting: *Deleted

## 2021-04-25 ENCOUNTER — Encounter: Payer: Self-pay | Admitting: Neurology

## 2021-04-25 DIAGNOSIS — G5793 Unspecified mononeuropathy of bilateral lower limbs: Secondary | ICD-10-CM

## 2021-04-25 NOTE — Telephone Encounter (Signed)
Patient requesting different facility for neurology. [facility] appt not until March

## 2021-04-26 ENCOUNTER — Ambulatory Visit (INDEPENDENT_AMBULATORY_CARE_PROVIDER_SITE_OTHER): Payer: Medicare Other | Admitting: Neurology

## 2021-04-26 ENCOUNTER — Encounter: Payer: Self-pay | Admitting: Neurology

## 2021-04-26 ENCOUNTER — Other Ambulatory Visit: Payer: Self-pay

## 2021-04-26 ENCOUNTER — Other Ambulatory Visit (INDEPENDENT_AMBULATORY_CARE_PROVIDER_SITE_OTHER): Payer: Medicare Other

## 2021-04-26 VITALS — BP 149/81 | HR 98 | Ht 63.0 in | Wt 136.0 lb

## 2021-04-26 DIAGNOSIS — G40909 Epilepsy, unspecified, not intractable, without status epilepticus: Secondary | ICD-10-CM

## 2021-04-26 DIAGNOSIS — G629 Polyneuropathy, unspecified: Secondary | ICD-10-CM

## 2021-04-26 DIAGNOSIS — R6889 Other general symptoms and signs: Secondary | ICD-10-CM

## 2021-04-26 DIAGNOSIS — M4180 Other forms of scoliosis, site unspecified: Secondary | ICD-10-CM | POA: Insufficient documentation

## 2021-04-26 LAB — SEDIMENTATION RATE: Sed Rate: 21 mm/hr (ref 0–30)

## 2021-04-26 LAB — C-REACTIVE PROTEIN: CRP: <1 mg/dL (ref 0.5–20.0)

## 2021-04-26 NOTE — Progress Notes (Signed)
[facility] Clinic Note - Initial Visit   Date: 04/26/21  Joel Mericle MRN: [id number] DOB: 29-Apr-1945   Dear Dr. Milinda Pointer:  Thank you for your kind referral of Appolonia Ackert for consultation of bilateral feet numbness/tinglin. Although her history is well known to you, please allow [place] to reiterate it for the purpose of our medical record. The patient was accompanied to the clinic by husband who also provides collateral information.     History of Present Illness: Desiree Everett is a 76 y.o. right-handed female with PAF, hypothyroidism, hyperlipidemia, GERD, and history of seizures presenting for evaluation of bilateral feet numbness/tingling.   Starting around June 2022, she began having numbness/tingling involving the soles from the mid-foot into the toes. Symptoms are constant and worse at night time and when at rest. She has tried OTC creams which helps. She has some imbalance and had one mechanical fall outside.  No weakness.  She also has achy pain right ankle.  She saw podiatry for left hammer toe and will be having surgery for this.  She was given prescription for gabapentin 168m and will be starting it soon.  She takes Keppra 5046mtwice daily for seizure disorder.  She has not had a seizure in 40 years.  She had 3 grand mal seizures in her 2064sand seizure-free since this time. She was started on dilantin and due to osteoporossis was switched to Ke[place] 15 years ago.    DATA: MRI Lumbar spine wo contrast 01/10/2019: 1. Moderate lumbar levoscoliosis. 2. Focally advanced disc degeneration at L1-2 with right-sided degenerative edema. 3. No evidence of neural impingement.  Past Medical History:  Diagnosis Date   Arthritis    "I'm sure I have some in my back" (08/12/2017)   Childhood asthma    Chronic lower back pain    GERD (gastroesophageal reflux disease)    History of hiatal hernia    History of kidney stones X 1    Hyperlipidemia    Hypothyroidism    Osteoporosis    [place]   Scoliosis    Seizure disorder ([facility]   no seizures since age [age]son keppra chronically (08/12/2017)    Past Surgical History:  Procedure Laterality Date   ATRIAL FIBRILLATION ABLATION N/A 08/12/2017   Procedure: ATReeves Surgeon: AlThompson GrayerMD;  [facility];  Service: Cardiovascular;  Laterality: N/A;   CARDIAC CATHETERIZATION  01/2017   TONSILLECTOMY     VAGINAL HYSTERECTOMY       Medications:  Outpatient Encounter Medications as of 04/26/2021  Medication Sig   Ascorbic Acid (VITAMIN C PO) Take by mouth.   atorvastatin (LIPITOR) 10 MG tablet Take 10 mg by mouth daily.   denosumab (PROLIA) 60 MG/ML SOLN injection Inject 60 mg into the skin every 6 (six) months. Administer in upper arm, thigh, or abdomen   gabapentin (NEURONTIN) 100 MG capsule Take 1 capsule (100 mg total) by mouth at bedtime.   levETIRAcetam (KEPPRA) 500 MG tablet Take 500 mg by mouth 2 (two) times daily.   levothyroxine (SYNTHROID, LEVOTHROID) 50 MCG tablet Take 50 mcg by mouth at bedtime.    meloxicam (MOBIC) 15 MG tablet Take 15 mg by mouth daily.   Multiple Vitamin (MULTIVITAMIN) capsule Take 1 capsule by mouth daily.   vitamin B-12 (CYANOCOBALAMIN) 1000 MCG tablet Take 1,000 mcg by mouth daily.   VITAMIN D, CHOLECALCIFEROL, PO Take 2,000 Units by mouth daily.  Biotin w/ Vitamins C & E (HAIR/SKIN/NAILS PO) Take by mouth. (Patient not taking: Reported on 04/26/2021)   esomeprazole (NEXIUM) 40 MG capsule Take 40 mg by mouth daily. (Patient not taking: Reported on 04/26/2021)   No facility-administered encounter medications on file as of 04/26/2021.    Allergies: No Known Allergies  Family History: Family History  Problem Relation Age of Onset   Arrhythmia Mother    Heart attack Father    Diabetes Father     Social History: Social History   Tobacco Use   Smoking  status: Never   Smokeless tobacco: Never  Vaping Use   Vaping Use: Never used  Substance Use Topics   Alcohol use: No   Drug use: No   Social History   Social History Narrative   Lives in [place]   Previously owned a salon   Has 2 daughters    Lives in a two story home    Right Handed     Vital Signs:  BP (!) 149/81    Pulse 98    Ht 5' 3"  (1.6 m)    Wt 136 lb (61.7 kg)    SpO2 97%    BMI 24.09 kg/m    Neurological Exam: MENTAL STATUS including orientation to time, place, person, recent and remote memory, attention span and concentration, language, and fund of knowledge is normal.  Speech is not dysarthric.  CRANIAL NERVES: II:  No visual field defects.   III-IV-VI: Pupils equal round and reactive to light.  Normal conjugate, extra-ocular eye movements in all directions of gaze.  No nystagmus.  No ptosis.   V:  Normal facial sensation.    VII:  Normal facial symmetry and movements.   VIII:  Normal hearing and vestibular function.   IX-X:  Normal palatal movement.   XI:  Normal shoulder shrug and head rotation.   XII:  Normal tongue strength and range of motion, no deviation or fasciculation.  MOTOR:  No atrophy, fasciculations or abnormal movements.  No pronator drift.   Upper Extremity:  Right  Left  Deltoid  5/5   5/5   Biceps  5/5   5/5   Triceps  5/5   5/5   Infraspinatus 5/5  5/5  Medial pectoralis 5/5  5/5  Wrist extensors  5/5   5/5   Wrist flexors  5/5   5/5   Finger extensors  5/5   5/5   Finger flexors  5/5   5/5   Dorsal interossei  5/5   5/5   Abductor pollicis  5/5   5/5   Tone (Ashworth scale)  0  0   Lower Extremity:  Right  Left  Hip flexors  5/5   5/5   Hip extensors  5/5   5/5   Adductor 5/5  5/5  Abductor 5/5  5/5  Knee flexors  5/5   5/5   Knee extensors  5/5   5/5   Dorsiflexors  5/5   5/5   Plantarflexors  5/5   5/5   Toe extensors  5/5   5/5   Toe flexors  5/5   5/5   Tone (Ashworth scale)  0  0   MSRs:  Right        Left                   brachioradialis 2+  2+  biceps 2+  2+  triceps 2+  2+  patellar 2+  2+  ankle jerk 1+  1+  Hoffman no  no  plantar response down  down   SENSORY:  Vibration is reduced at the great toe bilaterally, intact at the ankles.  Normal and symmetric perception of light touch, pinprick, and temperature.  Romberg's sign absent.   COORDINATION/GAIT: Normal finger-to- nose-finger.  Intact rapid alternating movements bilaterally.  Gait narrow based and stable. Stressed gait intact. Mild unsteadiness with tandem gait.    IMPRESSION: Bilateral feet paresthesias, most suggestive of peripheral neuropathy (likely idiopathic)  She does not have any risk factors for neuropathy.  I had extensive discussion with the patient regarding the pathogenesis, etiology, management, and natural course of neuropathy. Neuropathy tends to be slowly progressive.  I would like to test for treatable causes of neuropathy. I discussed that in the vast majority of cases, despite checking for reversible causes, we are unable to find the underlying etiology and management is symptomatic.  MRI lumbar spine viewed from 2020 and does not show any neural impingement. NCS/EMG of the legs Check ESR, CRP, vitamin B12, folate, copper, TSH, SPEP with IFE OK to start gabapentin 134m at bedtime, pt aware that dose may be subtherapeutic and will need to titrate it Patient educated on daily foot inspection, fall prevention, and safety precautions around the home.  2.   Seizure disorder, she has been seizure-free for 40+ years.  I offered to evaluate whether she needs to stay on AED by getting updated EEG and if it is normal, we can taper the medication and monitor. The only caveat is during medication withdrawal, driving is restricted for 6 months.  At this time, she is comfortable taking Keppra and tolerating it well.  She will contact the office, if she decides to proceed with EEG.  Patient to call/MyChart message with update in  4-6 weeks to determine gabapentin titration   Thank you for allowing me to participate in patient's care.  If I can answer any additional questions, I would be pleased to do so.    Sincerely,    Azizah Lisle K. PPosey Pronto DO

## 2021-04-26 NOTE — Patient Instructions (Addendum)
Nerve testing of the legs  Check labs  Check feet daily  Please contact my office if you would like to proceed with EEG  Take care walking on uneven ground  Call or send MyChart message with update in 4-6 weeks   ELECTROMYOGRAM AND NERVE CONDUCTION STUDIES (EMG/NCS) INSTRUCTIONS  How to Prepare The neurologist conducting the EMG will need to know if you have certain medical conditions. Tell the neurologist and other EMG lab personnel if you: Have a pacemaker or any other electrical medical device Take blood-thinning medications Have hemophilia, a blood-clotting disorder that causes prolonged bleeding Bathing Take a shower or bath shortly before your exam in order to remove oils from your skin. Dont apply lotions or creams before the exam.  What to Expect Youll likely be asked to change into a hospital gown for the procedure and lie down on an examination table. The following explanations can help you understand what will happen during the exam.  Electrodes. The neurologist or a technician places surface electrodes at various locations on your skin depending on where youre experiencing symptoms. Or the neurologist may insert needle electrodes at different sites depending on your symptoms.  Sensations. The electrodes will at times transmit a tiny electrical current that you may feel as a twinge or spasm. The needle electrode may cause discomfort or pain that usually ends shortly after the needle is removed. If you are concerned about discomfort or pain, you may want to talk to the neurologist about taking a short break during the exam.  Instructions. During the needle EMG, the neurologist will assess whether there is any spontaneous electrical activity when the muscle is at rest - activity that isnt present in healthy muscle tissue - and the degree of activity when you slightly contract the muscle.  He or she will give you instructions on resting and contracting a muscle at appropriate  times. Depending on what muscles and nerves the neurologist is examining, he or she may ask you to change positions during the exam.  After your EMG You may experience some temporary, minor bruising where the needle electrode was inserted into your muscle. This bruising should fade within several days. If it persists, contact your primary care doctor.

## 2021-04-27 LAB — VITAMIN B12: Vitamin B-12: >1504 pg/mL — ABNORMAL HIGH (ref 211–911)

## 2021-04-27 LAB — TSH: TSH: 1.58 u[IU]/mL (ref 0.35–5.50)

## 2021-04-27 LAB — FOLATE: Folate: >24.2 ng/mL (ref >5.9)

## 2021-05-01 ENCOUNTER — Encounter: Payer: Medicare Other | Admitting: Neurology

## 2021-05-02 LAB — PROTEIN ELECTROPHORESIS, SERUM
Albumin ELP: 3.8 g/dL (ref 3.8–4.8)
Alpha 1: 0.3 g/dL (ref 0.2–0.3)
Alpha 2: 0.8 g/dL (ref 0.5–0.9)
Beta 2: 0.4 g/dL (ref 0.2–0.5)
Beta Globulin: 0.4 g/dL (ref 0.4–0.6)
Gamma Globulin: 0.8 g/dL (ref 0.8–1.7)
Total Protein: 6.5 g/dL (ref 6.1–8.1)

## 2021-05-02 LAB — IMMUNOFIXATION ELECTROPHORESIS
IgG (Immunoglobin G), Serum: 816 mg/dL (ref 600–1540)
IgM, Serum: 102 mg/dL (ref 50–300)
Immunofix Electr Int: NOT DETECTED
Immunoglobulin A: 262 mg/dL (ref 70–320)

## 2021-05-02 LAB — COPPER, SERUM: Copper: 147 ug/dL (ref 70–175)

## 2021-05-04 ENCOUNTER — Telehealth: Payer: Self-pay | Admitting: Urology

## 2021-05-04 NOTE — Telephone Encounter (Signed)
Pt called wanting to know If you have heard from Dr. Nita Sickle. Pt states you wanted her to see Dr. Allena Katz before scheduling sx and to call back in a week if she hadn't heard from our office. Pt saw Dr. Allena Katz on 04/26/21. Pt is ready to schedule sx. Please Advise.

## 2021-05-08 ENCOUNTER — Telehealth: Payer: Self-pay | Admitting: Neurology

## 2021-05-08 NOTE — Telephone Encounter (Signed)
She may try OTC lidocaine cream (Aspercream, Salonpas), but it will say "with lidocaine". Lidocaine is a numbing medication, so these creams only help with pain or tingling. Unfortunately, there are no cream/medications to help with numbness.

## 2021-05-08 NOTE — Telephone Encounter (Signed)
Called patient and informed her of normal labs.Patient wanted to ask Dr. Allena Katz if there was any type of cream that she could use to help with her neuropathy. Patient does not like taking medication unless it is absolutely necessary. Patient informed me that she is taking Gabapentin 100 mg at bedtime and feels it is not quite working. I informed patient that we generally have patients take the gabapentin for about 2 weeks to allow the body time to adjust to the medication. Patient stated she would like to know if there is a cream that can help.  Informed patient that I will send Dr. Allena Katz a message with her question and give her a call once I hear back.

## 2021-05-08 NOTE — Telephone Encounter (Signed)
Patient is returning a call to mahina °

## 2021-05-09 NOTE — Telephone Encounter (Signed)
Called patient and informed her that she may just stop Gabapentin because she is on the lowest dose. Patient verbalized understanding and had no further questions or concerns.

## 2021-05-09 NOTE — Telephone Encounter (Signed)
Called patient and informed her on the creams that Dr. Allena Katz has reccommended. Patient was informed that the creams are to help with tingling and pain and does not help with neuropathy.   Patient informed me that she doesn't like all the side effects that come with Gabapentin and would like to stop taking it. Patient would like to know how she can stop it. Patient reports she has been taking the Gabapentin for 2 weeks.   I informed patient that I will send a message to Dr. Allena Katz for advice on stopping Gabapentin safely.

## 2021-05-09 NOTE — Telephone Encounter (Signed)
OK to stop it. She is only taking gabapentin 100mg  at bedtime (lowest dose).

## 2021-05-17 ENCOUNTER — Ambulatory Visit (INDEPENDENT_AMBULATORY_CARE_PROVIDER_SITE_OTHER): Payer: Medicare Other | Admitting: Podiatry

## 2021-05-17 ENCOUNTER — Other Ambulatory Visit: Payer: Self-pay

## 2021-05-17 ENCOUNTER — Encounter: Payer: Self-pay | Admitting: Podiatry

## 2021-05-17 DIAGNOSIS — G5793 Unspecified mononeuropathy of bilateral lower limbs: Secondary | ICD-10-CM | POA: Diagnosis not present

## 2021-05-17 DIAGNOSIS — M2042 Other hammer toe(s) (acquired), left foot: Secondary | ICD-10-CM | POA: Diagnosis not present

## 2021-05-17 DIAGNOSIS — M2012 Hallux valgus (acquired), left foot: Secondary | ICD-10-CM

## 2021-05-19 MED ORDER — PREGABALIN 50 MG PO CAPS: 50.0000 mg | ORAL_CAPSULE | Freq: Two times a day (BID) | ORAL | 3 refills | Status: DC

## 2021-05-19 NOTE — Progress Notes (Signed)
She presents today to discuss her neurology results which were positive for some idiopathic neuropathy also she notes that she has a seizure disorder she is trying to come off of some of that medication and was concerned about the gabapentin so she discontinued the use of that.  She would like to consider pregabalin if possible.  She would like to discuss her severe bunion deformity and hammertoe deformity and whether or not that can be repaired.  States that the hammertoe really does not bother her however she is having more pain to the distal aspect of the third and fourth toes of the same foot.  Objective: Vital signs are stable alert and oriented x3.  Pulses are palpable.  Severe bunion deformity cocked up hammertoe deformity with complete dislocation of the second metatarsophalangeal joint and complete tear with dorsal dislocation of the plantar plate and capsule itself.  The toe is flail but dorsiflexed.  The hallux valgus deformity is moderately reducible but the skin is very tight along the lateral margin.  Flexible hammertoe deformities fourth and third digits of the left foot.  Moderate tenderness on the distal palpation of the third toe.  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  Flexible hammertoe deformities #3 #4 of the left foot.  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  Plan: Discussed etiology pathology conservative surgical therapies at this point in time went ahead and explained to her why surgery would not be a good idea regarding the cocked up hammertoe and the bunion repair I did express to her that removing the toe if it were painful would be a good idea however she states that it is not painful only the third and fourth toes at the distal aspect are painful.  So at this point in time I feel that flexor tenotomy's to toes #3 #4 of the left foot would be beneficial to help alleviate her  symptoms there.  So we will get her scheduled back in the late evening for that.  In the meantime we are going to start her on Lyrica 50 mg 1 twice daily starting at nighttime titrating to twice daily.

## 2021-06-07 ENCOUNTER — Other Ambulatory Visit: Payer: Self-pay

## 2021-06-07 ENCOUNTER — Ambulatory Visit (INDEPENDENT_AMBULATORY_CARE_PROVIDER_SITE_OTHER): Payer: Medicare Other | Admitting: Podiatry

## 2021-06-07 DIAGNOSIS — M205X2 Other deformities of toe(s) (acquired), left foot: Secondary | ICD-10-CM | POA: Diagnosis not present

## 2021-06-07 DIAGNOSIS — M205X9 Other deformities of toe(s) (acquired), unspecified foot: Secondary | ICD-10-CM

## 2021-06-07 NOTE — Patient Instructions (Signed)
Leave bandage in place and dry for 4 days, then remove. You may wash foot normally after removal of bandage. DO NOT SOAK FOOT! Dry completely afterwards and may use a bandaid over incision if needed. We will follow up with you in 1 weeks for recheck.  

## 2021-06-07 NOTE — Progress Notes (Signed)
She presents today with her husband for flexor tenotomy to toes #3 #4 of the left foot.  She states that she is doing fine she would like to go ahead and have this done. ? ?Objective: Vital signs stable alert and oriented x3.  Pulses are palpable.  She has signed and consented for flexor tenotomy of toes #3 and 4 of the left foot.  They are tightly plantarflexed. ? ?Assessment: Hammertoe deformities contracture tendon third and fourth left. ? ?Plan: After consent was obtained local anesthetic consisting of 50-50 mixture Marcaine plain lidocaine plain was infiltrated in a block about the toes.  She tolerated procedure well was prepped and draped in normal sterile fashion.  Small stab incision was made with a #11 blade to transect the long flexor tendon at the level of the PIPJ.  The area was copiously washed with normal sterile saline then tried to utilize Dermabond however she today was bleeding just a bit actually placed 5-0 Ethibond.  Presto dressing was then applied and a Darco shoe was dispensed I will follow-up with her in 1 week ?

## 2021-06-14 ENCOUNTER — Ambulatory Visit (INDEPENDENT_AMBULATORY_CARE_PROVIDER_SITE_OTHER): Payer: Medicare Other | Admitting: Podiatry

## 2021-06-14 ENCOUNTER — Other Ambulatory Visit: Payer: Self-pay

## 2021-06-14 ENCOUNTER — Encounter: Payer: Self-pay | Admitting: Podiatry

## 2021-06-14 DIAGNOSIS — G5793 Unspecified mononeuropathy of bilateral lower limbs: Secondary | ICD-10-CM | POA: Diagnosis not present

## 2021-06-14 DIAGNOSIS — Z9889 Other specified postprocedural states: Secondary | ICD-10-CM | POA: Diagnosis not present

## 2021-06-14 DIAGNOSIS — M205X9 Other deformities of toe(s) (acquired), unspecified foot: Secondary | ICD-10-CM

## 2021-06-14 NOTE — Addendum Note (Signed)
Addended by: Kristian Covey on: 06/14/2021 05:08 PM ? ? Modules accepted: Level of Service ? ?

## 2021-06-14 NOTE — Progress Notes (Signed)
She presents today for follow-up of her tenotomy third and fourth toes left foot.  States they are doing fine she has not really had any problems with them however she does want to discuss her neuropathy. ? ?Objective: Vital signs are stable alert and oriented x3.  Pulses are palpable.  Sutures are intact margins are well coapted I would have remove the sutures today toes and remain rectus. ? ?Well-healing surgical toes #3 #4 of her left foot.  Idiopathic neuropathy. ? ?Plan: Discussed etiology pathology conservative therapies at this point in time remove sutures today to the toes also discussed the use of gabapentin and Lyrica which she did not tolerate well.  Discussed the possible use of Cymbalta.  Discussed acoustic wave therapy and discussed Dr. Lorrine Kin and spinal stimulator she understands this will notify us if she needs a referral.  Follow-up with me as needed ?

## 2021-06-21 ENCOUNTER — Ambulatory Visit: Payer: Medicare Other | Admitting: Podiatry

## 2021-06-25 ENCOUNTER — Ambulatory Visit: Payer: Medicare Other | Admitting: Neurology

## 2021-07-09 DIAGNOSIS — R0902 Hypoxemia: Secondary | ICD-10-CM | POA: Insufficient documentation

## 2021-10-05 DIAGNOSIS — G629 Polyneuropathy, unspecified: Secondary | ICD-10-CM | POA: Insufficient documentation

## 2021-12-27 ENCOUNTER — Ambulatory Visit (HOSPITAL_COMMUNITY): Payer: Medicare Other | Admitting: Physician Assistant

## 2022-01-08 ENCOUNTER — Ambulatory Visit (HOSPITAL_COMMUNITY): Payer: Medicare Other | Admitting: Physician Assistant

## 2022-01-21 ENCOUNTER — Inpatient Hospital Stay (HOSPITAL_COMMUNITY): Admission: RE | Admit: 2022-01-21 | Payer: Medicare Other | Source: Ambulatory Visit | Admitting: Physician Assistant

## 2022-02-06 ENCOUNTER — Encounter (HOSPITAL_COMMUNITY): Payer: Self-pay | Admitting: Physician Assistant

## 2022-02-06 ENCOUNTER — Ambulatory Visit (HOSPITAL_COMMUNITY)
Admission: RE | Admit: 2022-02-06 | Discharge: 2022-02-06 | Disposition: A | Discharge status: Home / Self Care | Payer: Medicare Other | Source: Ambulatory Visit | Attending: Physician Assistant | Admitting: Physician Assistant

## 2022-02-06 VITALS — BP 158/86 | HR 90 | Wt 142.6 lb

## 2022-02-06 DIAGNOSIS — E785 Hyperlipidemia, unspecified: Secondary | ICD-10-CM | POA: Diagnosis not present

## 2022-02-06 DIAGNOSIS — D6869 Other thrombophilia: Secondary | ICD-10-CM | POA: Insufficient documentation

## 2022-02-06 DIAGNOSIS — G40909 Epilepsy, unspecified, not intractable, without status epilepticus: Secondary | ICD-10-CM | POA: Insufficient documentation

## 2022-02-06 DIAGNOSIS — I480 Paroxysmal atrial fibrillation: Secondary | ICD-10-CM | POA: Insufficient documentation

## 2022-02-06 DIAGNOSIS — E039 Hypothyroidism, unspecified: Secondary | ICD-10-CM | POA: Insufficient documentation

## 2022-02-06 NOTE — Progress Notes (Signed)
Primary Care Physician: Romeo Rabon, MD Primary Cardiologist: none Primary Electrophysiologist: Dr Johney Frame Referring Physician: Dr Neale Burly Ewing Desiree Everett is a 76 y.o. female with a history of HLD, hypothyroidism, seizure disorder, atrial fibrillation who presents for follow up in the [facility].  The patient was initially diagnosed with atrial fibrillation 11/2016 and underwent ablation with Dr Johney Frame 08/12/17. Patient has a CHADS2VASC score of 3.   On follow up today, patient reports that she has done well since her last visit. She has not had any episodes of afib. She has an Scientist, physiological that she checks regularly. She is not currently on anticoagulation.   Today, she denies symptoms of palpitations, chest pain, shortness of breath, orthopnea, PND, lower extremity edema, dizziness, presyncope, syncope, snoring, daytime somnolence, bleeding, or neurologic sequela. The patient is tolerating medications without difficulties and is otherwise without complaint today.    Atrial Fibrillation Risk Factors:  she does not have symptoms or diagnosis of sleep apnea. she does not have a history of rheumatic fever.   she has a BMI of Body mass index is 25.26 kg/m.Marland Kitchen Filed Weights   02/06/22 1502  Weight: 64.7 kg    Family History  Problem Relation Age of Onset   Arrhythmia Mother    Heart attack Father    Diabetes Father      Atrial Fibrillation Management history:  Previous antiarrhythmic drugs: flecainide  Previous cardioversions: none Previous ablations: 08/12/17 CHADS2VASC score: 3 Anticoagulation history: Eliquis   Past Medical History:  Diagnosis Date   Arthritis    "I'm sure I have some in my back" (08/12/2017)   Childhood asthma    Chronic lower back pain    GERD (gastroesophageal reflux disease)    History of hiatal hernia    History of kidney stones X 1   Hyperlipidemia    Hypothyroidism    Osteoporosis    Paroxysmal atrial  fibrillation (HCC)    Scoliosis    Seizure disorder (HCC)    no seizures since age 78s, on keppra chronically (08/12/2017)   Past Surgical History:  Procedure Laterality Date   ATRIAL FIBRILLATION ABLATION N/A 08/12/2017   Procedure: ATRIAL FIBRILLATION ABLATION;  Surgeon: Hillis Range, MD;  Location: [facility];  Service: Cardiovascular;  Laterality: N/A;   CARDIAC CATHETERIZATION  01/2017   TONSILLECTOMY     VAGINAL HYSTERECTOMY      Current Outpatient Medications  Medication Sig Dispense Refill   amitriptyline (ELAVIL) 25 MG tablet Take 25 mg by mouth at bedtime.     Ascorbic Acid (VITAMIN C PO) Take 1,000 mg by mouth at bedtime.     atorvastatin (LIPITOR) 10 MG tablet Take 10 mg by mouth daily.     Biotin w/ Vitamins C & E (HAIR/SKIN/NAILS PO) Take by mouth daily.     denosumab (PROLIA) 60 MG/ML SOLN injection Inject 60 mg into the skin every 6 (six) months. Administer in upper arm, thigh, or abdomen     esomeprazole (NEXIUM) 40 MG capsule Take 40 mg by mouth daily.  5   levETIRAcetam (KEPPRA) 500 MG tablet Take 500 mg by mouth 2 (two) times daily.     levothyroxine (SYNTHROID, LEVOTHROID) 50 MCG tablet Take 50 mcg by mouth at bedtime.   11   meloxicam (MOBIC) 15 MG tablet Take 15 mg by mouth daily.     Multiple Vitamin (MULTIVITAMIN) capsule Take 1 capsule by mouth daily.     vitamin B-12 (CYANOCOBALAMIN) 1000  MCG tablet Take 1,000 mcg by mouth daily.     VITAMIN D, CHOLECALCIFEROL, PO Take 2,000 Units by mouth daily.     No current facility-administered medications for this encounter.    No Known Allergies  Social History   Socioeconomic History   Marital status: Married    Spouse name: Not on file   Number of children: 2   Years of education: Not on file   Highest education level: Not on file  Occupational History   Not on file  Tobacco Use   Smoking status: Never   Smokeless tobacco: Never  Vaping Use   Vaping Use: Never used  Substance and Sexual  Activity   Alcohol use: No   Drug use: No   Sexual activity: Yes  Other Topics Concern   Not on file  Social History Narrative   Lives in [place]   Previously owned a salon   Has 2 daughters    Lives in a two story home    Right Handed    Social Determinants of Health   Financial Resource Strain: Not on file  Food Insecurity: Not on file  Transportation Needs: Not on file  Physical Activity: Not on file  Stress: Not on file  Social Connections: Not on file  Intimate Partner Violence: Not on file     ROS- All systems are reviewed and negative except as per the HPI above.  Physical Exam: Vitals:   02/06/22 1502  BP: (!) 158/86  Pulse: 90  Weight: 64.7 kg    GEN- The patient is a well appearing elderly female, alert and oriented x 3 today.   Head- normocephalic, atraumatic Eyes-  Sclera clear, conjunctiva pink Ears- hearing intact Oropharynx- clear Neck- supple  Lungs- Clear to ausculation bilaterally, normal work of breathing Heart- Regular rate and rhythm, no murmurs, rubs or gallops  GI- soft, NT, ND, + BS Extremities- no clubbing, cyanosis, or edema MS- no significant deformity or atrophy Skin- no rash or lesion Psych- euthymic mood, full affect Neuro- strength and sensation are intact  Wt Readings from Last 3 Encounters:  02/06/22 64.7 kg  04/26/21 61.7 kg  12/18/20 60.8 kg    EKG today demonstrates  SR Vent. rate 90 BPM PR interval 130 ms QRS duration 78 ms QT/QTcB 382/467 ms   Epic records are reviewed at length today  CHA2DS2-VASc Score = 3  The patient's score is based upon: CHF History: 0 HTN History: 0 Diabetes History: 0 Stroke History: 0 Vascular Disease History: 0 Age Score: 2 Gender Score: 1       ASSESSMENT AND PLAN: 1. Paroxysmal Atrial Fibrillation (ICD10:  I48.0) The patient's CHA2DS2-VASc score is 3, indicating a 3.2% annual risk of stroke.   S/p afib ablation 08/12/17 Patient appears to be maintaining SR.  Apple  watch for home monitoring.  Per guidelines, she should be on anticoagulation but she is not currently per her preference.  2. Secondary Hypercoagulable State (ICD10:  D68.69) The patient is at significant risk for stroke/thromboembolism based upon her CHA2DS2-VASc Score of 3.  However, the patient is not on anticoagulation due to her preference.    Follow up to establish care with new EP in one year.    Jorja Loa PA-C [facility] [address] [place], [place] [address] [phone] 02/06/2022 3:23 PM

## 2022-02-14 DIAGNOSIS — R7303 Prediabetes: Secondary | ICD-10-CM | POA: Insufficient documentation

## 2022-07-30 ENCOUNTER — Ambulatory Visit: Payer: Medicare Other | Admitting: Neurology

## 2022-08-07 ENCOUNTER — Other Ambulatory Visit (INDEPENDENT_AMBULATORY_CARE_PROVIDER_SITE_OTHER): Payer: Medicare Other

## 2022-08-07 ENCOUNTER — Ambulatory Visit (INDEPENDENT_AMBULATORY_CARE_PROVIDER_SITE_OTHER): Payer: Medicare Other | Admitting: Neurology

## 2022-08-07 ENCOUNTER — Encounter: Payer: Self-pay | Admitting: Neurology

## 2022-08-07 VITALS — BP 146/76 | HR 93 | Ht 63.0 in | Wt 134.0 lb

## 2022-08-07 DIAGNOSIS — R6889 Other general symptoms and signs: Secondary | ICD-10-CM

## 2022-08-07 DIAGNOSIS — G40909 Epilepsy, unspecified, not intractable, without status epilepticus: Secondary | ICD-10-CM | POA: Diagnosis not present

## 2022-08-07 DIAGNOSIS — R252 Cramp and spasm: Secondary | ICD-10-CM | POA: Diagnosis not present

## 2022-08-07 DIAGNOSIS — G629 Polyneuropathy, unspecified: Secondary | ICD-10-CM

## 2022-08-07 MED ORDER — TIZANIDINE HCL 2 MG PO TABS: 2.0000 mg | ORAL_TABLET | Freq: Every evening | ORAL | 0 refills | Status: DC | PRN

## 2022-08-07 NOTE — Patient Instructions (Signed)
Check labs  Start posterior leg stretches  For severe cramps, you can tizanidine 2mg  at bedtime  Return to clinic in 6 months

## 2022-08-07 NOTE — Progress Notes (Signed)
Follow-up Visit   Date: 08/07/2022    Desiree Everett MRN: [id number] DOB: 1946-02-02    Desiree Everett is a 77 y.o. right-handed female with PAF, hypothyroidism, hyperlipidemia, GERD, and history of seizures returning to the clinic for follow-up of neuropathy, seizures, and new complaints of muscle cramps.  The patient was accompanied to the clinic by self.   IMPRESSION/PLAN: Muscle cramps - new  - Check CMP, magnesium, vitamin B12, folate, vitamin B6  - Start posterior leg stretches  - Start tizanidine 2mg  at bedtime as needed   2.  Idiopathic peripheral neuropathy  - Previously did not tolerate gabapentin  - Pain is not severe enough to take medications  - Patient educated on daily foot inspection, fall prevention, and safety precautions around the home.  3.  History of seizure.  She has been seizure-free for 40+ years.  At her last visit, I offered tapering Keppra, however she would like to stay on it and tolerating it well.  She will continue Keppra 500mg  twice daily   Return to clinic in 6 months  --------------------------------------------- History of present illness:  Starting around June 2022, she began having numbness/tingling involving the soles from the mid-foot into the toes. Symptoms are constant and worse at night time and when at rest. She has tried OTC creams which helps. She has some imbalance and had one mechanical fall outside.  No weakness.  She also has achy pain right ankle.  She saw podiatry for left hammer toe and will be having surgery for this.  She was given prescription for gabapentin 100mg  and will be starting it soon.   She takes Keppra 500mg  twice daily for seizure disorder.  She has not had a seizure in 40 years.  She had 3 grand mal seizures in her 23s, and seizure-free since this time. She was started on dilantin and due to osteoporossis was switched to Keppra about 15 years ago.    UPDATE 08/07/2022:  She is here with complaints of  bilateral cramps in the feet and ankles for the past month, which is worse at night.  Painful cramps can occur every 2 hours.  She takes mustard which helps.  She started exercising more and preparation of going on a family cruise.  She admits that she does not stretch before or after walking.  No weakness or low back pain.  At her last visit, gabapentin was offered for neuropathy.  She did not tolerate gabapentin due to cognitive side effects, so stopped it.  She reports that pain is bearable and slightly improved with OTC arthritis cream.    No interval seizures.    Medications:  Current Outpatient Medications on File Prior to Visit  Medication Sig Dispense Refill   Ascorbic Acid (VITAMIN C PO) Take 1,000 mg by mouth at bedtime.     atorvastatin (LIPITOR) 10 MG tablet Take 10 mg by mouth daily.     Biotin w/ Vitamins C & E (HAIR/SKIN/NAILS PO) Take by mouth daily.     denosumab (PROLIA) 60 MG/ML SOLN injection Inject 60 mg into the skin every 6 (six) months. Administer in upper arm, thigh, or abdomen     esomeprazole (NEXIUM) 40 MG capsule Take 40 mg by mouth daily.  5   levETIRAcetam (KEPPRA) 500 MG tablet Take 500 mg by mouth 2 (two) times daily.     levothyroxine (SYNTHROID, LEVOTHROID) 50 MCG tablet Take 50 mcg by mouth at bedtime.   11   meloxicam (MOBIC) 15  MG tablet Take 7.5 mg by mouth daily. Take half a tablet once a day     Multiple Vitamin (MULTIVITAMIN) capsule Take 1 capsule by mouth daily.     vitamin B-12 (CYANOCOBALAMIN) 1000 MCG tablet Take 1,000 mcg by mouth daily.     VITAMIN D, CHOLECALCIFEROL, PO Take 2,000 Units by mouth daily.     No current facility-administered medications on file prior to visit.    Allergies: No Known Allergies  Vital Signs:  BP (!) 146/76   Pulse 93   Ht 5\' 3"  (1.6 m)   Wt 134 lb (60.8 kg)   SpO2 91%   BMI 23.74 kg/m   Neurological Exam: MENTAL STATUS including orientation to time, place, person, recent and remote memory, attention  span and concentration, language, and fund of knowledge is normal.  Speech is not dysarthric.  CRANIAL NERVES:   Pupils equal round and reactive to light.  Normal conjugate, extra-ocular eye movements in all directions of gaze.  No ptosis.  Face is symmetric.   MOTOR:  Motor strength is 5/5 in all extremities, including distally.  No atrophy, fasciculations or abnormal movements.  No pronator drift.  Tone is normal.    MSRs:  Reflexes are 2+/4 throughout, except absent at the ankles.  SENSORY:  Vibration is reduced at the great toe bilaterally, intact at the ankles.  Normal and symmetric perception of light touch, pinprick, and temperature.  Romberg's sign absent.   COORDINATION/GAIT:  Normal finger-to- nose-finger.  Intact rapid alternating movements bilaterally.  Gait narrow based and stable.   Data:  MRI Lumbar spine wo contrast 01/10/2019: 1. Moderate lumbar levoscoliosis. 2. Focally advanced disc degeneration at L1-2 with right-sided degenerative edema. 3. No evidence of neural impingement.   Thank you for allowing me to participate in patient's care.  If I can answer any additional questions, I would be pleased to do so.    Sincerely,    Vannesa Abair K. Allena Katz, DO

## 2022-08-09 LAB — COMPREHENSIVE METABOLIC PANEL
Albumin: 4.2 g/dL (ref 3.6–5.1)
Alkaline phosphatase (APISO): 77 U/L (ref 37–153)
BUN: 17 mg/dL (ref 7–25)
Chloride: 106 mmol/L (ref 98–110)
Creat: 1.03 mg/dL — ABNORMAL HIGH (ref 0.60–1.00)
Glucose, Bld: 104 mg/dL — ABNORMAL HIGH (ref 65–99)

## 2022-08-09 LAB — B12 AND FOLATE PANEL: Folate: >24 ng/mL

## 2022-08-10 LAB — COMPREHENSIVE METABOLIC PANEL
AG Ratio: 1.5 (calc) (ref 1.0–2.5)
ALT: 8 U/L (ref 6–29)
AST: 23 U/L (ref 10–35)
BUN/Creatinine Ratio: 17 (calc) (ref 6–22)
CO2: 26 mmol/L (ref 20–32)
Calcium: 9.5 mg/dL (ref 8.6–10.4)
Globulin: 2.8 g/dL (calc) (ref 1.9–3.7)
Potassium: 4.2 mmol/L (ref 3.5–5.3)
Sodium: 141 mmol/L (ref 135–146)
Total Bilirubin: 0.3 mg/dL (ref 0.2–1.2)
Total Protein: 7 g/dL (ref 6.1–8.1)

## 2022-08-10 LAB — B12 AND FOLATE PANEL: Vitamin B-12: >2000 pg/mL — ABNORMAL HIGH (ref 200–1100)

## 2022-08-10 LAB — MAGNESIUM: Magnesium: 2.1 mg/dL (ref 1.5–2.5)

## 2022-08-10 LAB — VITAMIN B6: Vitamin B6: 14.6 ng/mL (ref 2.1–21.7)

## 2022-08-19 ENCOUNTER — Telehealth: Payer: Self-pay | Admitting: Neurology

## 2022-08-19 NOTE — Telephone Encounter (Signed)
F/u   Calling for test results  

## 2022-08-19 NOTE — Telephone Encounter (Signed)
Results have been relayed to the patient/authorized caretaker. The patient/authorized caretaker verbalized understanding. No questions at this time.   

## 2022-08-19 NOTE — Telephone Encounter (Signed)
Patient notified of all results. Patient states that she is taking the medicine for muscle spasms and they are better than before but they are still happening.

## 2022-08-19 NOTE — Telephone Encounter (Signed)
Glad that she is doing better. We can increase her tizanidine to 2mg  twice daily, if she is tolerating it.

## 2022-08-21 NOTE — Telephone Encounter (Signed)
Patient had another message and wasn't sure if it was for something new or something she already knew. I verified she remembered talking about increasing tizanidine to 2 mg BID and she did.

## 2022-08-21 NOTE — Telephone Encounter (Signed)
Patient called and left message with the AN stating she is returning a call from dr.patels assistant

## 2022-08-30 ENCOUNTER — Other Ambulatory Visit: Payer: Self-pay | Admitting: Neurology

## 2022-09-04 ENCOUNTER — Other Ambulatory Visit: Payer: Self-pay | Admitting: Neurology

## 2022-09-05 DIAGNOSIS — R252 Cramp and spasm: Secondary | ICD-10-CM | POA: Insufficient documentation

## 2022-10-15 ENCOUNTER — Ambulatory Visit: Payer: Medicare Other | Admitting: Neurology

## 2022-11-06 DIAGNOSIS — N8110 Cystocele, unspecified: Secondary | ICD-10-CM | POA: Insufficient documentation

## 2022-11-06 DIAGNOSIS — N289 Disorder of kidney and ureter, unspecified: Secondary | ICD-10-CM | POA: Insufficient documentation

## 2022-11-06 DIAGNOSIS — N8111 Cystocele, midline: Secondary | ICD-10-CM | POA: Insufficient documentation

## 2023-01-15 DIAGNOSIS — L989 Disorder of the skin and subcutaneous tissue, unspecified: Secondary | ICD-10-CM | POA: Insufficient documentation

## 2023-01-15 DIAGNOSIS — M810 Age-related osteoporosis without current pathological fracture: Secondary | ICD-10-CM | POA: Insufficient documentation

## 2023-02-03 ENCOUNTER — Ambulatory Visit: Payer: Medicare Other | Admitting: Cardiovascular Disease

## 2023-02-14 ENCOUNTER — Ambulatory Visit: Payer: Medicare Other | Attending: Cardiovascular Disease | Admitting: Cardiovascular Disease

## 2023-02-14 ENCOUNTER — Encounter: Payer: Self-pay | Admitting: Cardiovascular Disease

## 2023-02-14 VITALS — BP 162/92 | HR 73 | Ht 63.0 in | Wt 134.0 lb

## 2023-02-14 DIAGNOSIS — I480 Paroxysmal atrial fibrillation: Secondary | ICD-10-CM | POA: Diagnosis present

## 2023-02-14 NOTE — Patient Instructions (Signed)
Medication Instructions:  Your physician recommends that you continue on your current medications as directed. Please refer to the Current Medication list given to you today. *If you need a refill on your cardiac medications before your next appointment, please call your pharmacy*   Follow-Up: At [facility], you and your health needs are our priority.  As part of our continuing mission to provide you with exceptional heart care, we have created designated Provider Care Teams.  These Care Teams include your primary Cardiologist (physician) and Advanced Practice Providers (APPs -  Physician Assistants and Nurse Practitioners) who all work together to provide you with the care you need, when you need it.  We recommend signing up for the patient portal called "MyChart".  Sign up information is provided on this After Visit Summary.  MyChart is used to connect with patients for Virtual Visits (Telemedicine).  Patients are able to view lab/test results, encounter notes, upcoming appointments, etc.  Non-urgent messages can be sent to your provider as well.   To learn more about what you can do with MyChart, go to [email or web address].    Your next appointment:   6 month(s)  Provider:   You will follow up in the Atrial Fibrillation Clinic located at [facility]. Your provider will be: Clint R. Fenton, PA-C or Lake Bells, PA-C

## 2023-02-14 NOTE — Progress Notes (Signed)
  Electrophysiology Office Note:    Date:  02/14/2023   ID:  Desiree, Everett [birth year], MRN [id number]  PCP:  Romeo Rabon, MD   [facility] Providers Cardiologist:  Hillis Range, MD (Inactive) Electrophysiologist:  Hillis Range, MD (Inactive)     Referring MD: Romeo Rabon, MD   History of Present Illness:    Desiree Everett is a 77 y.o. female with a medical history significant for atrial fibrillation, hypothyroidism, seizure disorder referred for management of atrial fibrillation.     A-fib was initially diagnosed in September 2018.  She is a former patient of Dr. Johney Frame and underwent an ablation with him in May 2019.  She reports that she has been doing very well since the ablation.  She monitors her rhythm with an Apple watch and has not had any notifications of A-fib recurrence.  Her symptoms of A-fib are quite mild.     Today, she is doing very well. She has no palpitations, fatigue, chest discomfort.  EKGs/Labs/Other Studies Reviewed Today:     Echocardiogram:    Monitors:  Cardiac event monitor report March 2019 She has persistent atrial fibrillation, per report  Stress testing:   Advanced imaging:  Cardiac CT May 2019 Coronary calcium score 0.  Normal pulmonary vein anatomy    EKG:   EKG Interpretation Date/Time:  Friday February 14 2023 10:56:38 EST Ventricular Rate:  73 PR Interval:  128 QRS Duration:  88 QT Interval:  386 QTC Calculation: 425 R Axis:   28  Text Interpretation: Sinus rhythm with Premature atrial complexes Nonspecific ST abnormality When compared with ECG of 06-Feb-2022 15:13, Premature atrial complexes are now Present QT has shortened Confirmed by York Pellant [phone]) on 02/14/2023 11:17:34 AM     Physical Exam:    VS:  BP (!) 162/92   Pulse 73   Ht 5\' 3"  (1.6 m)   Wt 134 lb (60.8 kg)   SpO2 97%   BMI 23.74 kg/m     Wt Readings from Last 3 Encounters:  02/14/23 134 lb (60.8 kg)   08/07/22 134 lb (60.8 kg)  02/06/22 142 lb 9.6 oz (64.7 kg)     GEN:  Well nourished, well developed in no acute distress CARDIAC: RRR, no murmurs, rubs, gallops RESPIRATORY:  Normal work of breathing MUSCULOSKELETAL: no edema    ASSESSMENT & PLAN:     Persistent atrial fibrillation Fairly mild symptoms of AF - none since ablation Maintaining sinus rhythm after ablation Monitoring with apple watch  Secondary hypercoagulable state CHA2DS2-VASc score is 3 Off anticoagulation in the absence of any A-fib recurrence  Elevated blood pressure She is going to check home Bps and keep a log      Signed, Maurice Small, MD  02/14/2023 11:20 AM    [facility]

## 2023-02-17 ENCOUNTER — Ambulatory Visit: Payer: Medicare Other | Admitting: Neurology

## 2023-06-25 ENCOUNTER — Other Ambulatory Visit: Payer: Self-pay | Admitting: Neurology

## 2023-08-05 ENCOUNTER — Encounter: Payer: Self-pay | Admitting: Podiatry

## 2023-08-05 ENCOUNTER — Ambulatory Visit (INDEPENDENT_AMBULATORY_CARE_PROVIDER_SITE_OTHER): Admitting: Podiatry

## 2023-08-05 DIAGNOSIS — G8929 Other chronic pain: Secondary | ICD-10-CM | POA: Insufficient documentation

## 2023-08-05 DIAGNOSIS — R7302 Impaired glucose tolerance (oral): Secondary | ICD-10-CM | POA: Insufficient documentation

## 2023-08-05 DIAGNOSIS — D2372 Other benign neoplasm of skin of left lower limb, including hip: Secondary | ICD-10-CM | POA: Diagnosis not present

## 2023-08-05 DIAGNOSIS — R059 Cough, unspecified: Secondary | ICD-10-CM | POA: Insufficient documentation

## 2023-08-05 DIAGNOSIS — M4130 Thoracogenic scoliosis, site unspecified: Secondary | ICD-10-CM | POA: Insufficient documentation

## 2023-08-05 DIAGNOSIS — S0083XA Contusion of other part of head, initial encounter: Secondary | ICD-10-CM | POA: Insufficient documentation

## 2023-08-05 DIAGNOSIS — J329 Chronic sinusitis, unspecified: Secondary | ICD-10-CM | POA: Insufficient documentation

## 2023-08-05 DIAGNOSIS — I071 Rheumatic tricuspid insufficiency: Secondary | ICD-10-CM | POA: Insufficient documentation

## 2023-08-05 DIAGNOSIS — M2042 Other hammer toe(s) (acquired), left foot: Secondary | ICD-10-CM | POA: Diagnosis not present

## 2023-08-05 DIAGNOSIS — M2060 Acquired deformities of toe(s), unspecified, unspecified foot: Secondary | ICD-10-CM | POA: Insufficient documentation

## 2023-08-05 DIAGNOSIS — G40909 Epilepsy, unspecified, not intractable, without status epilepticus: Secondary | ICD-10-CM | POA: Insufficient documentation

## 2023-08-05 DIAGNOSIS — D61818 Other pancytopenia: Secondary | ICD-10-CM | POA: Insufficient documentation

## 2023-08-05 DIAGNOSIS — D709 Neutropenia, unspecified: Secondary | ICD-10-CM | POA: Insufficient documentation

## 2023-08-05 DIAGNOSIS — K219 Gastro-esophageal reflux disease without esophagitis: Secondary | ICD-10-CM | POA: Insufficient documentation

## 2023-08-05 DIAGNOSIS — I10 Essential (primary) hypertension: Secondary | ICD-10-CM | POA: Insufficient documentation

## 2023-08-05 DIAGNOSIS — M549 Dorsalgia, unspecified: Secondary | ICD-10-CM | POA: Insufficient documentation

## 2023-08-05 DIAGNOSIS — E559 Vitamin D deficiency, unspecified: Secondary | ICD-10-CM | POA: Insufficient documentation

## 2023-08-05 DIAGNOSIS — R002 Palpitations: Secondary | ICD-10-CM | POA: Insufficient documentation

## 2023-08-05 DIAGNOSIS — E785 Hyperlipidemia, unspecified: Secondary | ICD-10-CM | POA: Insufficient documentation

## 2023-08-05 DIAGNOSIS — N3941 Urge incontinence: Secondary | ICD-10-CM | POA: Insufficient documentation

## 2023-08-05 DIAGNOSIS — D696 Thrombocytopenia, unspecified: Secondary | ICD-10-CM | POA: Insufficient documentation

## 2023-08-05 DIAGNOSIS — K625 Hemorrhage of anus and rectum: Secondary | ICD-10-CM | POA: Insufficient documentation

## 2023-08-05 DIAGNOSIS — R350 Frequency of micturition: Secondary | ICD-10-CM | POA: Insufficient documentation

## 2023-08-05 DIAGNOSIS — M21619 Bunion of unspecified foot: Secondary | ICD-10-CM | POA: Insufficient documentation

## 2023-08-05 DIAGNOSIS — E039 Hypothyroidism, unspecified: Secondary | ICD-10-CM | POA: Insufficient documentation

## 2023-08-05 DIAGNOSIS — E7800 Pure hypercholesterolemia, unspecified: Secondary | ICD-10-CM | POA: Insufficient documentation

## 2023-08-06 NOTE — Progress Notes (Signed)
 She presents today after having not seen her for couple of years with a chief concern of pain to her left foot.  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  She states that she gets irritation of the toe with rubbing in her shoes.  Objective: Vital signs are stable alert and oriented x 3.  Pulses are palpable.  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  She has a reactive hyperkeratotic lesion to the plantar aspect of the second metatarsal head which is the most painful part of her foot.  This has a benign skin lesion plantar to it that does not demonstrate any skin breakdown or probing deep to the wound does not appear to be a preulcerative lesion.  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  Plan: Discussed etiology pathology conservative versus surgical therapies.  At this point she would like to have this toe surgically disarticulated at the metatarsal phalangeal joint.  She was consented for such today and will be scheduled at [facility] specialty surgical center.  We did discuss the pros and cons of this surgery as well as the possible side effects postop complications associated with it she understands this is amenable to it and is willing to take the risk.  We will schedule her all her in the near future for this procedure should there be questions or concerns she will notify us  immediately.  Dispensed information regarding the surgery center a cam boot for postop care and to wear prior to surgery to help prevent worsening of the irritation on the plantar aspect of the foot.  I also debrided the lesion today.

## 2023-08-13 ENCOUNTER — Ambulatory Visit (HOSPITAL_COMMUNITY): Payer: Medicare Other | Admitting: Physician Assistant

## 2023-08-21 ENCOUNTER — Ambulatory Visit (HOSPITAL_COMMUNITY)
Admission: RE | Admit: 2023-08-21 | Discharge: 2023-08-21 | Disposition: A | Discharge status: Home / Self Care | Source: Ambulatory Visit | Attending: Physician Assistant | Admitting: Physician Assistant

## 2023-08-21 ENCOUNTER — Encounter (HOSPITAL_COMMUNITY): Payer: Self-pay | Admitting: Physician Assistant

## 2023-08-21 VITALS — BP 130/100 | HR 82 | Ht 63.0 in | Wt 133.2 lb

## 2023-08-21 DIAGNOSIS — I480 Paroxysmal atrial fibrillation: Secondary | ICD-10-CM | POA: Insufficient documentation

## 2023-08-21 DIAGNOSIS — D6869 Other thrombophilia: Secondary | ICD-10-CM | POA: Insufficient documentation

## 2023-08-21 NOTE — Progress Notes (Signed)
 Primary Care Physician: Desiree Mcardle, MD Primary Cardiologist: none Primary Electrophysiologist: Dr Desiree Everett  Referring Physician: Dr Desiree Everett is a 78 y.o. female with a history of HLD, hypothyroidism, seizure disorder, atrial fibrillation who presents for follow up in the [facility] Atrial Fibrillation Clinic.  The patient was initially diagnosed with atrial fibrillation 11/2016 and underwent ablation with Dr Desiree Everett 08/12/17.   Patient returns for follow up for atrial fibrillation. She denies any interim symptoms of afib. Her smart watch has not alerted for any episodes. She checks her BP at home periodically and it has been within the normal range.   Today, she  denies symptoms of palpitations, chest pain, shortness of breath, orthopnea, PND, lower extremity edema, dizziness, presyncope, syncope, snoring, daytime somnolence, bleeding, or neurologic sequela. The patient is tolerating medications without difficulties and is otherwise without complaint today.    Atrial Fibrillation Risk Factors:  she does not have symptoms or diagnosis of sleep apnea. she does not have a history of rheumatic fever.   Atrial Fibrillation Management history:  Previous antiarrhythmic drugs: flecainide   Previous cardioversions: none Previous ablations: 08/12/17 Anticoagulation history: Eliquis    Past Medical History:  Diagnosis Date   Arthritis    "I'm sure I have some in my back" (08/12/2017)   Childhood asthma    Chronic lower back pain    GERD (gastroesophageal reflux disease)    History of hiatal hernia    History of kidney stones X 1   Hyperlipidemia    Hypothyroidism    Osteoporosis    Paroxysmal atrial fibrillation (HCC)    Scoliosis    Seizure disorder (HCC)    no seizures since age 21s, on keppra  chronically (08/12/2017)    Current Outpatient Medications  Medication Sig Dispense Refill   Ascorbic Acid (VITAMIN C PO) Take 1,000 mg by mouth at bedtime.      Biotin w/ Vitamins C & E (HAIR/SKIN/NAILS PO) Take by mouth daily.     denosumab (PROLIA) 60 MG/ML SOLN injection Inject 60 mg into the skin every 6 (six) months. Administer in upper arm, thigh, or abdomen     esomeprazole (NEXIUM) 40 MG capsule Take 40 mg by mouth daily.  5   levETIRAcetam  (KEPPRA ) 500 MG tablet Take 500 mg by mouth 2 (two) times daily.     levothyroxine  (SYNTHROID , LEVOTHROID) 50 MCG tablet Take 50 mcg by mouth at bedtime.   11   meloxicam (MOBIC) 15 MG tablet Take 7.5 mg by mouth daily. Take half a tablet once a day     Multiple Vitamin (MULTIVITAMIN) capsule Take 1 capsule by mouth daily.     rosuvastatin (CRESTOR) 5 MG tablet Take 5 mg by mouth daily.     vitamin B-12 (CYANOCOBALAMIN) 1000 MCG tablet Take 1,000 mcg by mouth daily.     VITAMIN D, CHOLECALCIFEROL, PO Take 2,000 Units by mouth daily.     No current facility-administered medications for this encounter.    ROS- All systems are reviewed and negative except as per the HPI above.  Physical Exam: Vitals:   08/21/23 1441  BP: (!) 130/100  Pulse: 82  Weight: 60.4 kg  Height: 5\' 3"  (1.6 m)    GEN: Well nourished, well developed in no acute distress CARDIAC: Regular rate and rhythm, no murmurs, rubs, gallops RESPIRATORY:  Clear to auscultation without rales, wheezing or rhonchi  ABDOMEN: Soft, non-tender, non-distended EXTREMITIES:  No edema; No deformity    Wt Readings from Last 3  Encounters:  08/21/23 60.4 kg  02/14/23 60.8 kg  08/07/22 60.8 kg    EKG today demonstrates  SR Vent. rate 82 BPM PR interval 144 ms QRS duration 86 ms QT/QTcB 398/464 ms   Epic records are reviewed at length today  CHA2DS2-VASc Score = 3  The patient's score is based upon: CHF History: 0 HTN History: 0 Diabetes History: 0 Stroke History: 0 Vascular Disease History: 0 Age Score: 2 Gender Score: 1       ASSESSMENT AND PLAN: Paroxysmal Atrial Fibrillation (ICD10:  I48.0) The patient's CHA2DS2-VASc score  is 3, indicating a 3.2% annual risk of stroke.   S/p afib ablation 08/12/17 Patient appears to be maintaining SR Apple Watch for home monitoring.   Secondary Hypercoagulable State (ICD10:  D68.69) The patient is at significant risk for stroke/thromboembolism based upon her CHA2DS2-VASc Score of 3.  However, the patient is not on anticoagulation due to her preference.     Elevated BP Diastolic BP elevated today. Her readings are 130s/80s at home. No changes today. Continue to monitor.    Follow up in the AF clinic in one year.     Desiree Ates PA-C [facility] [address] [place], [place] [address] [phone] 08/21/2023 2:48 PM

## 2023-09-10 ENCOUNTER — Other Ambulatory Visit: Payer: Self-pay | Admitting: Podiatry

## 2023-09-10 ENCOUNTER — Telehealth: Payer: Self-pay

## 2023-09-10 MED ORDER — CEPHALEXIN 500 MG PO CAPS
500.0000 mg | ORAL_CAPSULE | Freq: Three times a day (TID) | ORAL | 0 refills | Status: DC
Start: 2023-09-10 — End: 2023-10-23

## 2023-09-10 MED ORDER — OXYCODONE-ACETAMINOPHEN 10-325 MG PO TABS: 1.0000 | ORAL_TABLET | Freq: Three times a day (TID) | ORAL | 0 refills | Status: AC | PRN

## 2023-09-10 NOTE — Telephone Encounter (Signed)
 Patient called and left a message, asking if she needs to take antibiotics prior to her scheduled surgery on Friday 6/13. Per Dr. Lara Plants, she does not need to start antibiotics prior to surgery. She will receive IV antibiotics that day. She will start the oral antibiotics the day after surgery. Left detailed message, advised to call back with any questions. thanks

## 2023-09-11 ENCOUNTER — Telehealth: Payer: Self-pay | Admitting: Podiatry

## 2023-09-11 NOTE — Telephone Encounter (Signed)
 Pt left message stating she is having surgery tomorrow and needs to have a portable bedside potty and shower chair ordered. She said it was a [employer] on [address] in [place].   Please advise

## 2023-09-12 DIAGNOSIS — D2372 Other benign neoplasm of skin of left lower limb, including hip: Secondary | ICD-10-CM | POA: Diagnosis not present

## 2023-09-12 DIAGNOSIS — M2042 Other hammer toe(s) (acquired), left foot: Secondary | ICD-10-CM | POA: Diagnosis not present

## 2023-09-12 NOTE — Telephone Encounter (Signed)
 Called and pts daughter answered the phone and I told her that after surgery she is not to be bed ridden so she would not need those medical supplies she was asking for.

## 2023-09-13 ENCOUNTER — Encounter: Payer: Self-pay | Admitting: Podiatry

## 2023-09-15 ENCOUNTER — Telehealth: Payer: Self-pay

## 2023-09-15 MED ORDER — PROMETHAZINE HCL 25 MG PO TABS: 25.0000 mg | ORAL_TABLET | Freq: Three times a day (TID) | ORAL | 0 refills | Status: AC | PRN

## 2023-09-15 NOTE — Telephone Encounter (Signed)
 PA was approved 08/16/2023-09/14/24.

## 2023-09-15 NOTE — Telephone Encounter (Signed)
 PA request received from CoverMyMeds for Promethazine HCl 25 mg tablet. PA submitted and waiting on response.  Desiree Everett  (Key: [id number]) PA Case ID #: [id number] Rx #: [id number]

## 2023-09-18 ENCOUNTER — Encounter: Payer: Self-pay | Admitting: Podiatry

## 2023-09-18 ENCOUNTER — Ambulatory Visit (INDEPENDENT_AMBULATORY_CARE_PROVIDER_SITE_OTHER): Admitting: Podiatry

## 2023-09-18 DIAGNOSIS — M2042 Other hammer toe(s) (acquired), left foot: Secondary | ICD-10-CM | POA: Diagnosis not present

## 2023-09-22 NOTE — Progress Notes (Signed)
 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  Denies fever chills nausea vomit muscle aches pains calf pain back pain chest pain shortness of breath.  States that she would like to have a smaller boot or shoe if possible.  Objective: Vital signs stable alert oriented x 3 Dressel dressing intact once removed demonstrates no erythema edema cellulitis drainage or odor.  Sutures are intact margins well coapted.  Assessment: Well-healing surgical foot.  Plan: Redressed today dressed a compressive dressing placed in a Darco shoe recommend she continue to wear this at all times do not walk without it.  Follow-up with me in 2 to 3 weeks for suture removal.

## 2023-10-02 ENCOUNTER — Ambulatory Visit: Admitting: Podiatry

## 2023-10-02 DIAGNOSIS — M2042 Other hammer toe(s) (acquired), left foot: Secondary | ICD-10-CM

## 2023-10-02 NOTE — Progress Notes (Signed)
 She presents with her husband today denies fever chills nausea vomit muscle aches pains calf pain back pain chest pain shortness of breath.  She is status post amputation second digit left.  Objective: Vital signs are stable oriented x 3 there is no erythema edema salines drainage odor stitches are intact margins are well coapted.  Assessment: Well-healing surgical foot.  Plan: Remove sutures today Place Band-Aid allow her to start getting this wet instructed her to back into regular shoe gear explained to her that she may have some soreness in the lesser metatarsals.  She understands and is amenable to it.  Will follow-up with her in the near future.

## 2023-10-23 ENCOUNTER — Encounter: Payer: Self-pay | Admitting: Podiatry

## 2023-10-23 ENCOUNTER — Ambulatory Visit (INDEPENDENT_AMBULATORY_CARE_PROVIDER_SITE_OTHER): Admitting: Podiatry

## 2023-10-23 DIAGNOSIS — M2042 Other hammer toe(s) (acquired), left foot: Secondary | ICD-10-CM

## 2023-10-23 DIAGNOSIS — D2372 Other benign neoplasm of skin of left lower limb, including hip: Secondary | ICD-10-CM | POA: Diagnosis not present

## 2023-10-23 DIAGNOSIS — G5762 Lesion of plantar nerve, left lower limb: Secondary | ICD-10-CM | POA: Diagnosis not present

## 2023-10-23 DIAGNOSIS — Z9889 Other specified postprocedural states: Secondary | ICD-10-CM

## 2023-10-26 DIAGNOSIS — G5762 Lesion of plantar nerve, left lower limb: Secondary | ICD-10-CM | POA: Diagnosis not present

## 2023-10-26 MED ORDER — TRIAMCINOLONE ACETONIDE 40 MG/ML IJ SUSP
20.0000 mg | Freq: Once | INTRAMUSCULAR | Status: AC
Start: 2023-10-26 — End: 2023-10-26
  Administered 2023-10-26: 20 mg

## 2023-10-26 NOTE — Progress Notes (Signed)
 She presents today dated.  Surgery was in September 12, 2023 amputation second digit left foot.  She says the surgical site is doing better but have a callus on the bottom that is really painful.  The 2nd and 3rd toes are quite painful right and here.  Objective: Vital signs are stable alert oriented x 3.  Pulses are palpable.  There is no erythema edema cellulitis drainage or odor she does have prominent area of bursitis and reactive hyperkeratosis with benign skin lesion subsecond metatarsal head of the left foot.  She also has pain on palpation to the third interdigital space of the left foot.  Consistent with neuroma.  Assessment: Well-healing surgical foot painful benign skin lesion plantar aspect forefoot left.  Painful neuroma third interdigital space left foot.  Plan: Injected the third interdigital space of the left foot.  Debrided the reactive hyperkeratotic lesion.  I will follow-up with her for forefoot symptomatology as well as for a postop visit in 1 month.

## 2023-11-25 ENCOUNTER — Encounter: Admitting: Podiatry

## 2023-12-11 ENCOUNTER — Ambulatory Visit (INDEPENDENT_AMBULATORY_CARE_PROVIDER_SITE_OTHER): Admitting: Podiatry

## 2023-12-11 ENCOUNTER — Encounter: Payer: Self-pay | Admitting: Podiatry

## 2023-12-11 DIAGNOSIS — Z9889 Other specified postprocedural states: Secondary | ICD-10-CM

## 2023-12-11 DIAGNOSIS — M2042 Other hammer toe(s) (acquired), left foot: Secondary | ICD-10-CM

## 2023-12-11 DIAGNOSIS — G629 Polyneuropathy, unspecified: Secondary | ICD-10-CM

## 2023-12-11 DIAGNOSIS — M7742 Metatarsalgia, left foot: Secondary | ICD-10-CM

## 2023-12-11 MED ORDER — PREGABALIN 25 MG PO CAPS: 25.0000 mg | ORAL_CAPSULE | Freq: Every day | ORAL | 3 refills | Status: AC

## 2023-12-11 NOTE — Progress Notes (Signed)
 She presents with her daughter today for follow-up of her amputation.  States that she still hurts on the bottom of the second metatarsal as she ambulates.  She states that she has pain on the fourth metatarsal head plantarly as well and at nighttime she gets numbness and tingling in both feet however the 3rd and 4th toes of the left foot are most problematic with pain.  Objective: Vital signs are stable alert oriented x 3 surgical sites gone on to heal uneventfully she still has plantarflexed second metatarsal or elongated plantarflexed second metatarsal which is resulting in pain plantarly.  Loss of fat pad also is responsible for the majority of her metatarsalgia.  Neuropathy to be associated with her's scoliosis.  Gorgeously pathic neuropathy.  Assessment: Cannot rule out neuropathy.  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  Plan: Discussed etiology pathology and surgical therapies at this point she does not want to take gabapentin  she does not want to take Lyrica  however her daughter did talk her into trying the lowest dose of Lyrica  possible.  We sent in a 25 mg dose of Lyrica  for her today.  She will take it at nighttime and I will follow-up with her in 1 month.  I did dispense the silicone elastic sleeve for the metatarsalgia bilateral.  I will

## 2024-01-08 ENCOUNTER — Encounter: Admitting: Podiatry

## 2024-01-29 ENCOUNTER — Ambulatory Visit: Admitting: Podiatry

## 2024-03-04 ENCOUNTER — Ambulatory Visit (INDEPENDENT_AMBULATORY_CARE_PROVIDER_SITE_OTHER): Admitting: Podiatry

## 2024-03-04 DIAGNOSIS — G629 Polyneuropathy, unspecified: Secondary | ICD-10-CM | POA: Diagnosis not present

## 2024-03-04 MED ORDER — PREGABALIN 50 MG PO CAPS: 50.0000 mg | ORAL_CAPSULE | Freq: Every day | ORAL | 5 refills | Status: AC

## 2024-03-04 NOTE — Progress Notes (Signed)
 She presents today with her daughter stating that her foot and everything is pretty much the same she stopped taking the Lyrica  she was worried about the side effects 1 month into the course.  She been taking it at night and noticed no changes.  States that her neuropathy is only from her midfoot out on both feet  Objective: Vital signs are stable alert oriented x 3 no change in physical exam.  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  Plan: We discussed her starting at 50 mg and her daughter encouraged her to do so to just see if that would help with her nocturnal symptomatology.  I will follow-up with her on an as-needed basis.
